# Patient Record
Sex: Male | Born: 1945 | Race: White | Hispanic: No | Marital: Married | Smoking: Current every day smoker
Health system: Southern US, Community
[De-identification: ages and names within clinical notes are randomized; demographics above are authoritative.]

## PROBLEM LIST (undated history)

## (undated) DIAGNOSIS — S42009A Fracture of unspecified part of unspecified clavicle, initial encounter for closed fracture: Secondary | ICD-10-CM

## (undated) DIAGNOSIS — E079 Disorder of thyroid, unspecified: Secondary | ICD-10-CM

## (undated) DIAGNOSIS — I671 Cerebral aneurysm, nonruptured: Secondary | ICD-10-CM

## (undated) DIAGNOSIS — E7800 Pure hypercholesterolemia, unspecified: Secondary | ICD-10-CM

## (undated) DIAGNOSIS — C4490 Unspecified malignant neoplasm of skin, unspecified: Secondary | ICD-10-CM

## (undated) DIAGNOSIS — K219 Gastro-esophageal reflux disease without esophagitis: Secondary | ICD-10-CM

## (undated) HISTORY — PX: CEREBRAL ANEURYSM REPAIR: SHX164

---

## 1999-08-22 ENCOUNTER — Encounter: Admission: RE | Admit: 1999-08-22 | Discharge: 1999-08-22 | Payer: Self-pay | Admitting: Family Medicine

## 1999-11-29 ENCOUNTER — Ambulatory Visit (HOSPITAL_BASED_OUTPATIENT_CLINIC_OR_DEPARTMENT_OTHER): Admission: RE | Admit: 1999-11-29 | Discharge: 1999-11-29 | Payer: Self-pay | Admitting: Plastic Surgery

## 2000-06-18 ENCOUNTER — Encounter: Payer: Self-pay | Admitting: Neurological Surgery

## 2000-06-18 ENCOUNTER — Ambulatory Visit (HOSPITAL_COMMUNITY): Admission: RE | Admit: 2000-06-18 | Discharge: 2000-06-18 | Payer: Self-pay | Admitting: Neurological Surgery

## 2006-01-31 ENCOUNTER — Encounter: Payer: Self-pay | Admitting: Family Medicine

## 2006-06-19 ENCOUNTER — Encounter: Admission: RE | Admit: 2006-06-19 | Discharge: 2006-06-19 | Payer: Self-pay | Admitting: Family Medicine

## 2008-01-28 ENCOUNTER — Ambulatory Visit: Payer: Self-pay | Admitting: Gastroenterology

## 2008-02-10 ENCOUNTER — Ambulatory Visit: Payer: Self-pay | Admitting: Gastroenterology

## 2008-02-10 ENCOUNTER — Encounter: Payer: Self-pay | Admitting: Gastroenterology

## 2008-02-11 ENCOUNTER — Encounter: Payer: Self-pay | Admitting: Gastroenterology

## 2011-12-18 ENCOUNTER — Encounter (HOSPITAL_BASED_OUTPATIENT_CLINIC_OR_DEPARTMENT_OTHER): Payer: Self-pay | Admitting: *Deleted
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
## 2011-12-21 ENCOUNTER — Ambulatory Visit (HOSPITAL_BASED_OUTPATIENT_CLINIC_OR_DEPARTMENT_OTHER): Payer: BC Managed Care – PPO | Admitting: Anesthesiology

## 2011-12-21 ENCOUNTER — Other Ambulatory Visit: Payer: Self-pay

## 2011-12-21 ENCOUNTER — Encounter (HOSPITAL_BASED_OUTPATIENT_CLINIC_OR_DEPARTMENT_OTHER): Payer: Self-pay | Admitting: Anesthesiology

## 2011-12-21 ENCOUNTER — Encounter (HOSPITAL_BASED_OUTPATIENT_CLINIC_OR_DEPARTMENT_OTHER)
Admission: RE | Disposition: A | Discharge status: Home / Self Care | Payer: Self-pay | Source: Ambulatory Visit | Attending: Orthopedic Surgery

## 2011-12-21 ENCOUNTER — Ambulatory Visit (HOSPITAL_BASED_OUTPATIENT_CLINIC_OR_DEPARTMENT_OTHER)
Admission: RE | Admit: 2011-12-21 | Discharge: 2011-12-22 | Disposition: A | Discharge status: Home / Self Care | Payer: BC Managed Care – PPO | Source: Ambulatory Visit | Attending: Orthopedic Surgery | Admitting: Orthopedic Surgery

## 2011-12-21 ENCOUNTER — Encounter (HOSPITAL_BASED_OUTPATIENT_CLINIC_OR_DEPARTMENT_OTHER): Payer: Self-pay | Admitting: *Deleted

## 2011-12-21 DIAGNOSIS — Y998 Other external cause status: Secondary | ICD-10-CM | POA: Insufficient documentation

## 2011-12-21 DIAGNOSIS — F17200 Nicotine dependence, unspecified, uncomplicated: Secondary | ICD-10-CM | POA: Insufficient documentation

## 2011-12-21 DIAGNOSIS — Z4789 Encounter for other orthopedic aftercare: Secondary | ICD-10-CM

## 2011-12-21 DIAGNOSIS — M1710 Unilateral primary osteoarthritis, unspecified knee: Secondary | ICD-10-CM | POA: Insufficient documentation

## 2011-12-21 DIAGNOSIS — S42023A Displaced fracture of shaft of unspecified clavicle, initial encounter for closed fracture: Secondary | ICD-10-CM | POA: Insufficient documentation

## 2011-12-21 HISTORY — DX: Fracture of unspecified part of unspecified clavicle, initial encounter for closed fracture: S42.009A

## 2011-12-21 HISTORY — PX: ORIF CLAVICULAR FRACTURE: SHX5055

## 2011-12-21 SURGERY — OPEN REDUCTION INTERNAL FIXATION (ORIF) CLAVICULAR FRACTURE
Anesthesia: General | Site: Shoulder | Laterality: Left | Wound class: Contaminated

## 2011-12-21 MED ORDER — SODIUM CHLORIDE 0.9 % IV SOLN: INTRAVENOUS | Status: DC

## 2011-12-21 MED ORDER — CEFAZOLIN SODIUM 1-5 GM-% IV SOLN: 1.0000 g | INTRAVENOUS | Status: DC

## 2011-12-21 MED ORDER — ONDANSETRON HCL 4 MG/2ML IJ SOLN: 4.0000 mg | Freq: Once | INTRAMUSCULAR | Status: AC | PRN

## 2011-12-21 MED ORDER — LACTATED RINGERS IV SOLN
INTRAVENOUS | Status: DC
  Administered 2011-12-21 (×2): via INTRAVENOUS

## 2011-12-21 MED ORDER — MIDAZOLAM HCL 5 MG/5ML IJ SOLN
INTRAMUSCULAR | Status: DC | PRN
  Administered 2011-12-21: 2 mg via INTRAVENOUS

## 2011-12-21 MED ORDER — METHOCARBAMOL 100 MG/ML IJ SOLN: 500.0000 mg | Freq: Four times a day (QID) | INTRAVENOUS | Status: DC | PRN

## 2011-12-21 MED ORDER — HYDROMORPHONE HCL PF 1 MG/ML IJ SOLN: 0.5000 mg | INTRAMUSCULAR | Status: DC | PRN

## 2011-12-21 MED ORDER — ONDANSETRON HCL 4 MG/2ML IJ SOLN
INTRAMUSCULAR | Status: DC | PRN
  Administered 2011-12-21: 4 mg via INTRAVENOUS

## 2011-12-21 MED ORDER — SUCCINYLCHOLINE CHLORIDE 20 MG/ML IJ SOLN
INTRAMUSCULAR | Status: DC | PRN
  Administered 2011-12-21: 100 mg via INTRAVENOUS

## 2011-12-21 MED ORDER — METOCLOPRAMIDE HCL 5 MG/ML IJ SOLN: 5.0000 mg | Freq: Three times a day (TID) | INTRAMUSCULAR | Status: DC | PRN

## 2011-12-21 MED ORDER — CEFAZOLIN SODIUM 1-5 GM-% IV SOLN
INTRAVENOUS | Status: DC | PRN
  Administered 2011-12-21: 2 g via INTRAVENOUS

## 2011-12-21 MED ORDER — FENTANYL CITRATE 0.05 MG/ML IJ SOLN
INTRAMUSCULAR | Status: DC | PRN
  Administered 2011-12-21: 100 ug via INTRAVENOUS
  Administered 2011-12-21 (×2): 50 ug via INTRAVENOUS

## 2011-12-21 MED ORDER — HYDROMORPHONE HCL PF 1 MG/ML IJ SOLN
0.2500 mg | INTRAMUSCULAR | Status: DC | PRN
  Administered 2011-12-21 (×4): 0.5 mg via INTRAVENOUS

## 2011-12-21 MED ORDER — LIDOCAINE HCL (CARDIAC) 20 MG/ML IV SOLN
INTRAVENOUS | Status: DC | PRN
  Administered 2011-12-21: 100 mg via INTRAVENOUS

## 2011-12-21 MED ORDER — POTASSIUM CHLORIDE IN NACL 20-0.9 MEQ/L-% IV SOLN: INTRAVENOUS | Status: DC

## 2011-12-21 MED ORDER — METOCLOPRAMIDE HCL 5 MG PO TABS: 5.0000 mg | ORAL_TABLET | Freq: Three times a day (TID) | ORAL | Status: DC | PRN

## 2011-12-21 MED ORDER — BUPIVACAINE HCL (PF) 0.5 % IJ SOLN
INTRAMUSCULAR | Status: DC | PRN
  Administered 2011-12-21: 20 mL

## 2011-12-21 MED ORDER — ONDANSETRON HCL 4 MG/2ML IJ SOLN: 4.0000 mg | Freq: Four times a day (QID) | INTRAMUSCULAR | Status: DC | PRN

## 2011-12-21 MED ORDER — METHOCARBAMOL 500 MG PO TABS
500.0000 mg | ORAL_TABLET | Freq: Four times a day (QID) | ORAL | Status: DC | PRN
  Administered 2011-12-21: 500 mg via ORAL

## 2011-12-21 MED ORDER — DEXAMETHASONE SODIUM PHOSPHATE 4 MG/ML IJ SOLN
INTRAMUSCULAR | Status: DC | PRN
  Administered 2011-12-21: 10 mg via INTRAVENOUS

## 2011-12-21 MED ORDER — CEFAZOLIN SODIUM-DEXTROSE 2-3 GM-% IV SOLR: 2.0000 g | INTRAVENOUS | Status: DC

## 2011-12-21 MED ORDER — OXYCODONE-ACETAMINOPHEN 5-325 MG PO TABS
1.0000 | ORAL_TABLET | ORAL | Status: DC | PRN
  Administered 2011-12-21 – 2011-12-22 (×3): 2 via ORAL

## 2011-12-21 MED ORDER — MORPHINE SULFATE 2 MG/ML IJ SOLN: 0.0500 mg/kg | INTRAMUSCULAR | Status: DC | PRN

## 2011-12-21 MED ORDER — KETOROLAC TROMETHAMINE 30 MG/ML IJ SOLN
INTRAMUSCULAR | Status: DC | PRN
  Administered 2011-12-21: 30 mg via INTRAVENOUS

## 2011-12-21 MED ORDER — ONDANSETRON HCL 4 MG PO TABS: 4.0000 mg | ORAL_TABLET | Freq: Four times a day (QID) | ORAL | Status: DC | PRN

## 2011-12-21 MED ORDER — EPHEDRINE SULFATE 50 MG/ML IJ SOLN
INTRAMUSCULAR | Status: DC | PRN
  Administered 2011-12-21: 5 mg via INTRAVENOUS
  Administered 2011-12-21: 10 mg via INTRAVENOUS

## 2011-12-21 MED ORDER — LORATADINE 10 MG PO TABS: 10.0000 mg | ORAL_TABLET | Freq: Every day | ORAL | Status: DC

## 2011-12-21 MED ORDER — CEFAZOLIN SODIUM 1-5 GM-% IV SOLN
1.0000 g | Freq: Three times a day (TID) | INTRAVENOUS | Status: DC
  Administered 2011-12-21 – 2011-12-22 (×2): 1 g via INTRAVENOUS

## 2011-12-21 MED ORDER — PROPOFOL 10 MG/ML IV EMUL
INTRAVENOUS | Status: DC | PRN
  Administered 2011-12-21 (×2): 50 mg via INTRAVENOUS
  Administered 2011-12-21: 200 mg via INTRAVENOUS

## 2011-12-21 SURGICAL SUPPLY — 57 items
BENZOIN TINCTURE PRP APPL 2/3 (GAUZE/BANDAGES/DRESSINGS) IMPLANT
BIT DRILL 2.8X5 QR DISP (BIT) ×2 IMPLANT
BLADE SURG 15 STRL LF DISP TIS (BLADE) ×1 IMPLANT
BLADE SURG 15 STRL SS (BLADE) ×1
BLADE SURG ROTATE 9660 (MISCELLANEOUS) IMPLANT
CLOTH BEACON ORANGE TIMEOUT ST (SAFETY) ×2 IMPLANT
DECANTER SPIKE VIAL GLASS SM (MISCELLANEOUS) IMPLANT
DRAPE U-SHAPE 47X51 STRL (DRAPES) ×2 IMPLANT
DRAPE U-SHAPE 76X120 STRL (DRAPES) ×4 IMPLANT
DURAPREP 26ML APPLICATOR (WOUND CARE) ×2 IMPLANT
ELECT REM PT RETURN 9FT ADLT (ELECTROSURGICAL) ×2
ELECTRODE REM PT RTRN 9FT ADLT (ELECTROSURGICAL) ×1 IMPLANT
GAUZE XEROFORM 1X8 LF (GAUZE/BANDAGES/DRESSINGS) ×2 IMPLANT
GLOVE BIO SURGEON STRL SZ 6.5 (GLOVE) ×2 IMPLANT
GLOVE BIOGEL PI IND STRL 7.0 (GLOVE) ×1 IMPLANT
GLOVE BIOGEL PI IND STRL 8 (GLOVE) ×1 IMPLANT
GLOVE BIOGEL PI INDICATOR 7.0 (GLOVE) ×1
GLOVE BIOGEL PI INDICATOR 8 (GLOVE) ×1
GLOVE ORTHO TXT STRL SZ7.5 (GLOVE) ×4 IMPLANT
GOWN BRE IMP PREV XXLGXLNG (GOWN DISPOSABLE) ×2 IMPLANT
GOWN PREVENTION PLUS XLARGE (GOWN DISPOSABLE) ×4 IMPLANT
IMMOBILIZER SHOULDER XLGE (ORTHOPEDIC SUPPLIES) ×2 IMPLANT
NS IRRIG 1000ML POUR BTL (IV SOLUTION) ×2 IMPLANT
PACK ARTHROSCOPY DSU (CUSTOM PROCEDURE TRAY) ×2 IMPLANT
PACK BASIN DAY SURGERY FS (CUSTOM PROCEDURE TRAY) ×2 IMPLANT
PENCIL BUTTON HOLSTER BLD 10FT (ELECTRODE) ×2 IMPLANT
PLATE ANT CLAV 6 HOLE (Plate) ×2 IMPLANT
SCREW BN 20X3.5XHEXNS CORT (Screw) ×1 IMPLANT
SCREW CORT 3.5X16 (Screw) ×6 IMPLANT
SCREW CORT 3.5X20 (Screw) ×1 IMPLANT
SCREW CORTICAL 3.5X22 (Screw) ×4 IMPLANT
SLEEVE SCD COMPRESS KNEE MED (MISCELLANEOUS) ×2 IMPLANT
SLING ARM FOAM STRAP LRG (SOFTGOODS) IMPLANT
SLING ARM FOAM STRAP MED (SOFTGOODS) IMPLANT
SLING ARM FOAM STRAP XLG (SOFTGOODS) IMPLANT
SLING ARM IMMOBILIZER LRG (SOFTGOODS) IMPLANT
SLING ARM IMMOBILIZER MED (SOFTGOODS) IMPLANT
SPONGE GAUZE 4X4 12PLY (GAUZE/BANDAGES/DRESSINGS) ×2 IMPLANT
SPONGE LAP 18X18 X RAY DECT (DISPOSABLE) ×4 IMPLANT
STAPLER VISISTAT 35W (STAPLE) ×2 IMPLANT
STRIP CLOSURE SKIN 1/2X4 (GAUZE/BANDAGES/DRESSINGS) IMPLANT
SUCTION FRAZIER TIP 10 FR DISP (SUCTIONS) ×2 IMPLANT
SUT ETHILON 3 0 PS 1 (SUTURE) IMPLANT
SUT FIBERWIRE #2 38 T-5 BLUE (SUTURE)
SUT RETRIEVER MED (INSTRUMENTS) IMPLANT
SUT VIC AB 0 CT1 27 (SUTURE) ×1
SUT VIC AB 0 CT1 27XBRD ANBCTR (SUTURE) ×1 IMPLANT
SUT VIC AB 2-0 SH 27 (SUTURE) ×1
SUT VIC AB 2-0 SH 27XBRD (SUTURE) ×1 IMPLANT
SUT VIC AB 3-0 SH 27 (SUTURE)
SUT VIC AB 3-0 SH 27X BRD (SUTURE) IMPLANT
SUTURE FIBERWR #2 38 T-5 BLUE (SUTURE) IMPLANT
SYR BULB 3OZ (MISCELLANEOUS) IMPLANT
SYR BULB IRRIGATION 50ML (SYRINGE) ×2 IMPLANT
TOWEL OR 17X24 6PK STRL BLUE (TOWEL DISPOSABLE) ×2 IMPLANT
WATER STERILE IRR 1000ML POUR (IV SOLUTION) IMPLANT
YANKAUER SUCT BULB TIP NO VENT (SUCTIONS) ×2 IMPLANT

## 2011-12-21 NOTE — Brief Op Note (Signed)
12/21/2011  2:40 PM  PATIENT:  Alejandro Arnold  66 y.o. male  PRE-OPERATIVE DIAGNOSIS:  left clavicle fracture closed  POST-OPERATIVE DIAGNOSIS:  left clavicle fracture closed  PROCEDURE:  Procedure(s) (LRB): OPEN REDUCTION INTERNAL FIXATION (ORIF) CLAVICULAR FRACTURE (Left)  SURGEON:  Surgeon(s) and Role:    * Loreta Ave, MD - Primary  PHYSICIAN ASSISTANT: Zita Ozimek M  ANESTHESIA:   general  EBL:  Total I/O In: 1500 [I.V.:1500] Out: -    SPECIMEN:  No Specimen  DISPOSITION OF SPECIMEN:  N/A  COUNTS:  YES  TOURNIQUET:  * No tourniquets in log *  PATIENT DISPOSITION:  PACU - hemodynamically stable.

## 2011-12-21 NOTE — Anesthesia Procedure Notes (Signed)
Procedure Name: Intubation Date/Time: 12/21/2011 1:14 PM Performed by: Gar Gibbon Pre-anesthesia Checklist: Patient identified, Emergency Drugs available, Suction available and Patient being monitored Patient Re-evaluated:Patient Re-evaluated prior to inductionOxygen Delivery Method: Circle System Utilized Preoxygenation: Pre-oxygenation with 100% oxygen Intubation Type: IV induction Ventilation: Mask ventilation without difficulty Laryngoscope Size: Mac and 3 Tube type: Oral Number of attempts: 1 Airway Equipment and Method: stylet and oral airway Placement Confirmation: positive ETCO2 and breath sounds checked- equal and bilateral Secured at: 22 cm Tube secured with: Tape Dental Injury: Teeth and Oropharynx as per pre-operative assessment  Comments: Large, floppy epiglottis. Cords not entirely visualized

## 2011-12-21 NOTE — Interval H&P Note (Signed)
History and Physical Interval Note:  12/21/2011 7:38 AM  Alejandro Arnold  has presented today for surgery, with the diagnosis of left clavicle fracture closed  The various methods of treatment have been discussed with the patient and family. After consideration of risks, benefits and other options for treatment, the patient has consented to  Procedure(s) (LRB): OPEN REDUCTION INTERNAL FIXATION (ORIF) CLAVICULAR FRACTURE (Left) as a surgical intervention .  The patients' history has been reviewed, patient examined, no change in status, stable for surgery.  I have reviewed the patients' chart and labs.  Questions were answered to the patient's satisfaction.     Annel Zunker F

## 2011-12-21 NOTE — Transfer of Care (Signed)
Immediate Anesthesia Transfer of Care Note  Patient: Alejandro Arnold  Procedure(s) Performed: Procedure(s) (LRB): OPEN REDUCTION INTERNAL FIXATION (ORIF) CLAVICULAR FRACTURE (Left)  Patient Location: PACU  Anesthesia Type: General  Level of Consciousness: awake  Airway & Oxygen Therapy: Patient Spontanous Breathing and Patient connected to face mask oxygen  Post-op Assessment: Report given to PACU RN and Post -op Vital signs reviewed and stable  Post vital signs: Reviewed and stable  Complications: No apparent anesthesia complications

## 2011-12-21 NOTE — Anesthesia Postprocedure Evaluation (Signed)
  Anesthesia Post-op Note  Patient: Alejandro Arnold  Procedure(s) Performed: Procedure(s) (LRB): OPEN REDUCTION INTERNAL FIXATION (ORIF) CLAVICULAR FRACTURE (Left)  Patient Location: PACU  Anesthesia Type: General  Level of Consciousness: awake and alert   Airway and Oxygen Therapy: Patient Spontanous Breathing and Patient connected to face mask oxygen  Post-op Pain: mild  Post-op Assessment: Post-op Vital signs reviewed, Patient's Cardiovascular Status Stable, Respiratory Function Stable, Patent Airway, No signs of Nausea or vomiting and Pain level controlled  Post-op Vital Signs: Reviewed and stable  Complications: No apparent anesthesia complications

## 2011-12-21 NOTE — Anesthesia Preprocedure Evaluation (Signed)
Anesthesia Evaluation  Patient identified by MRN, date of birth, ID band Patient awake    Reviewed: Allergy & Precautions, H&P , NPO status , Patient's Chart, lab work & pertinent test results  Airway Mallampati: I TM Distance: >3 FB Neck ROM: Full    Dental  (+) Teeth Intact, Dental Advisory Given and Poor Dentition   Pulmonary  breath sounds clear to auscultation        Cardiovascular Rhythm:Regular Rate:Normal     Neuro/Psych    GI/Hepatic   Endo/Other    Renal/GU      Musculoskeletal   Abdominal   Peds  Hematology   Anesthesia Other Findings   Reproductive/Obstetrics                           Anesthesia Physical Anesthesia Plan  ASA: II  Anesthesia Plan: General   Post-op Pain Management:    Induction: Intravenous  Airway Management Planned: Oral ETT  Additional Equipment:   Intra-op Plan:   Post-operative Plan: Extubation in OR  Informed Consent: I have reviewed the patients History and Physical, chart, labs and discussed the procedure including the risks, benefits and alternatives for the proposed anesthesia with the patient or authorized representative who has indicated his/her understanding and acceptance.     Plan Discussed with: CRNA, Anesthesiologist and Surgeon  Anesthesia Plan Comments:         Anesthesia Quick Evaluation

## 2011-12-22 NOTE — Op Note (Signed)
NAME:  Alejandro Arnold, Alejandro Arnold NO.:  MEDICAL RECORD NO.:  [id number]  LOCATION:                                 FACILITY:  PHYSICIAN:  Loreta Ave, M.D.      DATE OF BIRTH:  DATE OF PROCEDURE:  12/21/2011 DATE OF DISCHARGE:                              OPERATIVE REPORT   PREOPERATIVE DIAGNOSIS:  Markedly displaced overriding clavicle fracture, left.  Junction middle and lateral third.  POSTOPERATIVE DIAGNOSIS:  Markedly displaced overriding clavicle fracture, left.  Junction middle and lateral third.  PROCEDURE:  Open reduction and internal fixation of left clavicle fracture with a 6-hole anterior AcuMed plate and screws.  SURGEON:  Loreta Ave, MD  ASSISTANT:  Genene Churn. Barry Dienes, [place], present throughout the entire case, necessary for timely completion of procedure.  ANESTHESIA:  General.  BLOOD LOSS:  Minimal.  SPECIMENS:  None.  CULTURES:  None.  COMPLICATIONS:  None.  DRESSINGS:  Soft compressive with sling.  PROCEDURE:  The patient was brought to the operating room, placed on the operating room table in supine position.  After adequate anesthesia had been obtained, placed in a beach-chair position on the shoulder positioner, prepped and draped in usual sterile fashion.  Fluoroscopic guidance was used throughout.  Incision along the front of the clavicle. Skin and subcutaneous tissues were divided.  Fracture medially evident with the medial side spike button hole through the fascia. Subperiosteal exposure of the fracture itself.  Utilizing clamps and retractors, this was able to be reduced anatomically.  Then, fascia and anterior 6-hole plate to contour along the front of the clavicle. Holding had reduced, this was then firmly fixed with screws throughout. At completion, nice solid stable fixation.  Anatomic alignment confirmed visually as well as fluoroscopically.  Wound was irrigated. Deltopectoral fascia was closed over top of the  clavicle.  Skin and subcutaneous tissues were closed with Vicryl.  Sterile compressive dressing applied. Sling applied.  Anesthesia was reversed.  Brought to the recovery room. Tolerated the surgery well.  No complications.     Loreta Ave, M.D.     DFM/MEDQ  D:  12/21/2011  T:  12/22/2011  Job:  [id number]

## 2011-12-28 ENCOUNTER — Encounter (HOSPITAL_BASED_OUTPATIENT_CLINIC_OR_DEPARTMENT_OTHER): Payer: Self-pay | Admitting: Orthopedic Surgery

## 2015-08-27 ENCOUNTER — Encounter: Payer: Self-pay | Admitting: Gastroenterology

## 2015-10-03 HISTORY — PX: BICEPS TENDON REPAIR: SHX566

## 2016-02-25 ENCOUNTER — Other Ambulatory Visit: Payer: Self-pay | Admitting: Radiation Oncology

## 2016-02-25 ENCOUNTER — Inpatient Hospital Stay
Admission: RE | Admit: 2016-02-25 | Discharge: 2016-02-25 | Disposition: A | Discharge status: Home / Self Care | Payer: Self-pay | Source: Ambulatory Visit | Attending: Radiation Oncology | Admitting: Radiation Oncology

## 2016-02-25 DIAGNOSIS — C801 Malignant (primary) neoplasm, unspecified: Secondary | ICD-10-CM

## 2016-02-29 ENCOUNTER — Encounter: Payer: Self-pay | Admitting: Radiation Oncology

## 2016-02-29 NOTE — Progress Notes (Addendum)
Thoracic Location of Tumor / Histology:  Enlarging left lower lung nodule hypermetabolic on pet-Ct imaging 01/21/16  Patient presented months ago with symptoms of: none, [facility],  Biopsies of  (if applicable) revealed: none done, sent CD   Tobacco/Marijuana/Snuff/ETOH use: 1ppd x 55 years, decreased now to 4-5 cigarettes daily  Past/Anticipated interventions by cardiothoracic surgery, if any: NO, Seen in the [place]  Dr. Mickel Crow   Past/Anticipated interventions by medical oncology, if any: NO  Signs/Symptoms  Weight changes, if any:Yes loss 16 lbs in past year   Respiratory complaints, if any: NO  Hemoptysis, if any: NO  Pain issues, if any:  Knee pain,  SAFETY ISSUES: NO  Prior radiation? NO  Pacemaker/ICD? NO  Is the patient on methotrexate? NO  Current Complaints / other details:  Married, Nurse, children's; Step children 3 sons, Seen in [place] by Dr.  Juan Quam,  Referral for possible SRS  ;   Father bladder cancer, brother throat cancer, maternal grandfather throat cancer   Allergies:NKA BP 125/70 mmHg  Pulse 62  Temp(Src) 98.3 F (36.8 C) (Oral)  Resp 20  Ht '6\' 2"'$  (1.88 m)  Wt 186 lb 11.2 oz (84.687 kg)  BMI 23.96 kg/m2  SpO2 96%  Wt Readings from Last 3 Encounters:  03/01/16 186 lb 11.2 oz (84.687 kg)  12/18/11 188 lb (85.276 kg)

## 2016-03-01 ENCOUNTER — Ambulatory Visit
Admission: RE | Admit: 2016-03-01 | Discharge: 2016-03-01 | Disposition: A | Discharge status: Home / Self Care | Payer: No Typology Code available for payment source | Source: Ambulatory Visit | Attending: Radiation Oncology | Admitting: Radiation Oncology

## 2016-03-01 ENCOUNTER — Encounter: Payer: Self-pay | Admitting: Radiation Oncology

## 2016-03-01 VITALS — BP 125/70 | HR 62 | Temp 98.3°F | Resp 20 | Ht 74.0 in | Wt 186.7 lb

## 2016-03-01 DIAGNOSIS — E7800 Pure hypercholesterolemia, unspecified: Secondary | ICD-10-CM | POA: Insufficient documentation

## 2016-03-01 DIAGNOSIS — F17200 Nicotine dependence, unspecified, uncomplicated: Secondary | ICD-10-CM | POA: Diagnosis not present

## 2016-03-01 DIAGNOSIS — Z85828 Personal history of other malignant neoplasm of skin: Secondary | ICD-10-CM | POA: Diagnosis not present

## 2016-03-01 DIAGNOSIS — C3432 Malignant neoplasm of lower lobe, left bronchus or lung: Secondary | ICD-10-CM | POA: Insufficient documentation

## 2016-03-01 DIAGNOSIS — Z510 Encounter for antineoplastic radiation therapy: Secondary | ICD-10-CM | POA: Diagnosis present

## 2016-03-01 DIAGNOSIS — K219 Gastro-esophageal reflux disease without esophagitis: Secondary | ICD-10-CM | POA: Insufficient documentation

## 2016-03-01 DIAGNOSIS — Z8052 Family history of malignant neoplasm of bladder: Secondary | ICD-10-CM | POA: Diagnosis not present

## 2016-03-01 DIAGNOSIS — I671 Cerebral aneurysm, nonruptured: Secondary | ICD-10-CM | POA: Diagnosis not present

## 2016-03-01 HISTORY — DX: Gastro-esophageal reflux disease without esophagitis: K21.9

## 2016-03-01 HISTORY — DX: Cerebral aneurysm, nonruptured: I67.1

## 2016-03-01 HISTORY — DX: Unspecified malignant neoplasm of skin, unspecified: C44.90

## 2016-03-01 HISTORY — DX: Pure hypercholesterolemia, unspecified: E78.00

## 2016-03-01 NOTE — Progress Notes (Signed)
Please see the Nurse Progress Note in the MD Initial Consult Encounter for this patient. 
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
## 2016-03-10 ENCOUNTER — Ambulatory Visit
Admission: RE | Admit: 2016-03-10 | Discharge: 2016-03-10 | Disposition: A | Discharge status: Home / Self Care | Payer: No Typology Code available for payment source | Source: Ambulatory Visit | Attending: Radiation Oncology | Admitting: Radiation Oncology

## 2016-03-10 DIAGNOSIS — C3432 Malignant neoplasm of lower lobe, left bronchus or lung: Secondary | ICD-10-CM

## 2016-03-10 DIAGNOSIS — Z510 Encounter for antineoplastic radiation therapy: Secondary | ICD-10-CM | POA: Diagnosis not present

## 2016-03-17 DIAGNOSIS — Z510 Encounter for antineoplastic radiation therapy: Secondary | ICD-10-CM | POA: Diagnosis not present

## 2016-03-29 ENCOUNTER — Ambulatory Visit
Admission: RE | Admit: 2016-03-29 | Discharge: 2016-03-29 | Disposition: A | Discharge status: Home / Self Care | Payer: No Typology Code available for payment source | Source: Ambulatory Visit | Attending: Radiation Oncology | Admitting: Radiation Oncology

## 2016-03-29 DIAGNOSIS — Z510 Encounter for antineoplastic radiation therapy: Secondary | ICD-10-CM | POA: Diagnosis not present

## 2016-04-03 ENCOUNTER — Ambulatory Visit
Admission: RE | Admit: 2016-04-03 | Discharge: 2016-04-03 | Disposition: A | Discharge status: Home / Self Care | Payer: No Typology Code available for payment source | Source: Ambulatory Visit | Attending: Radiation Oncology | Admitting: Radiation Oncology

## 2016-04-03 DIAGNOSIS — Z510 Encounter for antineoplastic radiation therapy: Secondary | ICD-10-CM | POA: Diagnosis not present

## 2016-04-05 ENCOUNTER — Ambulatory Visit: Payer: No Typology Code available for payment source | Admitting: Radiation Oncology

## 2016-04-06 ENCOUNTER — Ambulatory Visit
Admission: RE | Admit: 2016-04-06 | Discharge: 2016-04-06 | Disposition: A | Discharge status: Home / Self Care | Payer: No Typology Code available for payment source | Source: Ambulatory Visit | Attending: Radiation Oncology | Admitting: Radiation Oncology

## 2016-04-06 ENCOUNTER — Encounter: Payer: Self-pay | Admitting: Radiation Oncology

## 2016-04-06 VITALS — BP 164/79 | HR 56 | Temp 97.6°F | Ht 74.0 in | Wt 191.1 lb

## 2016-04-06 DIAGNOSIS — Z510 Encounter for antineoplastic radiation therapy: Secondary | ICD-10-CM | POA: Diagnosis not present

## 2016-04-06 DIAGNOSIS — C3432 Malignant neoplasm of lower lobe, left bronchus or lung: Secondary | ICD-10-CM | POA: Insufficient documentation

## 2016-04-06 NOTE — Progress Notes (Signed)
   Department of Radiation Oncology  Phone:  [phone] Fax:        [phone]  Weekly Treatment Note    Name: ANIAS BARTOL Date: 04/06/2016 MRN: [id number] DOB: 1945/11/22   Diagnosis:     ICD-9-CM ICD-10-CM   1. 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 cetirizine (ZYRTEC) 10 MG tablet Take 10 mg by mouth daily.    . diclofenac (VOLTAREN) 75 MG EC tablet Take 75 mg by mouth daily.    . diclofenac sodium (VOLTAREN) 1 % GEL Apply topically as needed.    Marland Kitchen omeprazole (PRILOSEC) 20 MG capsule Take 20 mg by mouth daily.    Marland Kitchen tiotropium (SPIRIVA) 18 MCG inhalation capsule Place 18 mcg into inhaler and inhale daily. Reported on 04/06/2016     No current facility-administered medications for this encounter.     ALLERGIES: Review of patient's allergies indicates no known allergies.   LABORATORY DATA:  Lab Results  Component Value Date   HGB 16.5 12/21/2011   No results found for: NA, K, CL, CO2 No results found for: ALT, AST, GGT, ALKPHOS, BILITOT   NARRATIVE: Blain R Bonsall was seen today for weekly treatment management. The chart was checked and the patient's films were reviewed.  Siddarth Hsiung has completed treatment with 3 fractions to his left lung.  He denies having pain.  He reports having an occasional cough with white sputum.  He denies having hemoptysis.  He denies having an increase in shortness of breath.  He reports his energy level is "all right."  He has been given a one month follow up appointment.  BP 164/79 mmHg  Pulse 56  Temp(Src) 97.6 F (36.4 C) (Oral)  Ht '6\' 2"'$  (1.88 m)  Wt 191 lb 1.6 oz (86.682 kg)  BMI 24.53 kg/m2  SpO2 99%   Wt Readings from Last 3 Encounters:  04/06/16 191 lb 1.6 oz (86.682 kg)  03/01/16 186 lb 11.2 oz (84.687 kg)  12/18/11 188 lb (85.276 kg)    PHYSICAL EXAMINATION:  height is '6\' 2"'$  (1.88 m) and weight is 191 lb 1.6 oz (86.682 kg). His oral temperature is 97.6 F (36.4 C). His blood pressure is 164/79 and his pulse is 56. His oxygen saturation is 99%.        ASSESSMENT: The patient is doing satisfactorily with treatment.  PLAN: Follow-up in one month.

## 2016-04-06 NOTE — Progress Notes (Signed)
Aizik Reh has completed treatment with 3 fractions to his left lung.  He denies having pain.  He reports having an occasional cough with white sputum.  He denies having hemoptysis.  He denies having an increase in shortness of breath.  He reports his energy level is "all right."  He has been given a one month follow up appointment.  BP 164/79 mmHg  Pulse 56  Temp(Src) 97.6 F (36.4 C) (Oral)  Ht '6\' 2"'$  (1.88 m)  Wt 191 lb 1.6 oz (86.682 kg)  BMI 24.53 kg/m2  SpO2 99%   Wt Readings from Last 3 Encounters:  04/06/16 191 lb 1.6 oz (86.682 kg)  03/01/16 186 lb 11.2 oz (84.687 kg)  12/18/11 188 lb (85.276 kg)

## 2016-04-13 NOTE — Progress Notes (Signed)
°  Radiation Oncology         (336) [phone] ________________________________  Name: WINDEL KEZIAH MRN: [id number]  Date: 04/06/2016  DOB: 04-28-1946  End of Treatment Note  Diagnosis:   The encounter diagnosis was Primary malignant neoplasm of bronchus of left lower lobe ([place]).     Indication for treatment:  Curative       Radiation treatment dates:   03/29/2016 to 04/06/2016  Site/dose:   The Left lung was treated to 54 Gy in 3 fractions at 18 Gy per fraction.   Beams/energy:   SBRT/SRT-3D // 6FFF  Narrative: The patient tolerated radiation treatment relatively well.   He did not experience pain, hemoptysis, fatigue, or an increase in shortness of breath. He did report having an occasional cough with white sputum.   Plan: The patient has completed radiation treatment. The patient will return to radiation oncology clinic for routine followup in one month. I advised them to call or return sooner if they have any questions or concerns related to their recovery or treatment.  ------------------------------------------------  Jodelle Gross, MD, PhD  This document serves as a record of services personally performed by Kyung Rudd, MD. It was created on his behalf by Arlyce Harman, a trained medical scribe. The creation of this record is based on the scribe's personal observations and the provider's statements to them. This document has been checked and approved by the attending provider.

## 2016-04-22 NOTE — Progress Notes (Signed)
  Radiation Oncology         [phone]) [phone] ________________________________  Name: DAILAN PFALZGRAF MRN: [id number]  Date: 03/10/2016  DOB: 1945-11-02  RESPIRATORY MOTION MANAGEMENT SIMULATION  NARRATIVE:  In order to account for effect of respiratory motion on target structures and other organs in the planning and delivery of radiotherapy, this patient underwent respiratory motion management simulation.  To accomplish this, when the patient was brought to the CT simulation planning suite, 4D respiratoy motion management CT images were obtained.  The CT images were loaded into the planning software.  Then, using a variety of tools including Cine, MIP, and standard views, the target volume and planning target volumes (PTV) were delineated.  Avoidance structures were contoured.  Treatment planning then occurred.  Dose volume histograms were generated and reviewed for each of the requested structure.  The resulting plan was carefully reviewed and approved today.  ------------------------------------------------  Jodelle Gross, MD, PhD

## 2016-04-22 NOTE — Addendum Note (Signed)
Encounter addended by: Kyung Rudd, MD on: 04/22/2016 11:44 PM<BR>     Documentation filed: Notes Section

## 2016-04-22 NOTE — Progress Notes (Signed)
[facility] Radiation Oncology Simulation and Treatment Planning Note   Name:  '@PATNAME'$ @ MRN: [id number]   Date: 04/22/2016  DOB: [birth year]  Status:outpatient    DIAGNOSIS: '@CURRDX'$ @   CONSENT VERIFIED:yes   SET UP: Patient is setup supine   IMMOBILIZATION: The patient was immobilized using a Vac Loc bag and Abdominal Compression.   NARRATIVE:The patient was brought to the [facility].  Identity was confirmed.  All relevant records and images related to the planned course of therapy were reviewed.  Then, the patient was positioned in a stable reproducible clinical set-up for radiation therapy. Abdominal compression was applied by me.  4D CT images were obtained and reproducible breathing pattern was confirmed. Free breathing CT images were obtained.  Skin markings were placed.  The CT images were loaded into the planning software where the target and avoidance structures were contoured.  The radiation prescription was entered and confirmed.    TREATMENT PLANNING NOTE:  Treatment planning then occurred. I have requested : MLC's, isodose plan, basic dose calculation.  3 dimensional simulation is performed and dose volume histogram of the gross tumor volume, planning tumor volume and criticial normal structures including the spinal cord and lungs were analyzed and requested.  Special treatment procedure was performed due to high dose per fraction.  The patient will be monitored for increased risk of toxicity.  Daily imaging using cone beam CT will be used for target localization.  The left lung tumor will receive 54 gray in 3 fractions.   ------------------------------------------------  Jodelle Gross, MD, PhD

## 2016-04-22 NOTE — Addendum Note (Signed)
Encounter addended by: Kyung Rudd, MD on: 04/22/2016 11:02 PM<BR>     Documentation filed: Notes Section, Visit Diagnoses

## 2016-05-09 ENCOUNTER — Ambulatory Visit: Payer: Self-pay | Admitting: Radiation Oncology

## 2017-10-18 NOTE — Progress Notes (Signed)
Please place orders in Epic as patient is being scheduled for a pre-op appointment! Thank you! 

## 2017-10-19 ENCOUNTER — Ambulatory Visit: Payer: Self-pay | Admitting: Orthopedic Surgery

## 2017-10-23 NOTE — Progress Notes (Addendum)
10-12-17 Surgery clearance on chart from Dr. Alferd Patee  09-12-17 Pulmonary clearance on chart from Dr. Gwenette Greet  08-03-17 CT Thorax w/o Contrast on chart

## 2017-10-23 NOTE — Patient Instructions (Signed)
Alejandro Arnold Arnold  10/23/2017   Your procedure is scheduled on: 10-25-17   Report to [facility] Main  Entrance Report to Admitting at 1:30 PM   Call this number if you have problems the morning of surgery [phone]     Remember: Do not eat food or drink liquids :After Midnight. You may have a Clear Liquid Diet from Midnight until 10:00 AM. After 10:00 AM, nothing until after surgery.     CLEAR LIQUID DIET   Foods Allowed                                                                     Foods Excluded  Coffee and tea, regular and decaf                             liquids that you cannot  Plain Jell-O in any flavor                                             see through such as: Fruit ices (not with fruit pulp)                                     milk, soups, orange juice  Iced Popsicles                                    All solid food Carbonated beverages, regular and diet                                    Cranberry, grape and apple juices Sports drinks like Gatorade Lightly seasoned clear broth or consume(fat free) Sugar, honey syrup  Sample Menu Breakfast                                Lunch                                     Supper Cranberry juice                    Beef broth                            Chicken broth Jell-O                                     Grape juice  Apple juice Coffee or tea                        Jell-O                                      Popsicle                                                Coffee or tea                        Coffee or tea  _____________________________________________________________________     Take these medicines the morning of surgery with A SIP OF WATER: Cetirizine (Zyrtec), and Omeprazole (Prilosec). You may also bring and use your inhaler as needed.                                You may not have any metal on your body including hair pins and              piercings   Do not wear jewelry, make-up, lotions, powders or perfumes, deodorant             Do not wear nail polish.  Do not shave  48 hours prior to surgery.              Men may shave face and neck.   Do not bring valuables to the hospital. [place].  Contacts, dentures or bridgework may not be worn into surgery.  Leave suitcase in the car. After surgery it may be brought to your room.                 Please read over the following fact sheets you were given: _____________________________________________________________________          [facility] - Preparing for Surgery Before surgery, you Alejandro Arnold play an important role.  Because skin is not sterile, your skin needs to be as free of germs as possible.  You Alejandro Arnold reduce the number of germs on your skin by washing with CHG (chlorahexidine gluconate) soap before surgery.  CHG is an antiseptic cleaner which kills germs and bonds with the skin to continue killing germs even after washing. Please DO NOT use if you have an allergy to CHG or antibacterial soaps.  If your skin becomes reddened/irritated stop using the CHG and inform your nurse when you arrive at Short Stay. Do not shave (including legs and underarms) for at least 48 hours prior to the first CHG shower.  You may shave your face/neck. Please follow these instructions carefully:  1.  Shower with CHG Soap the night before surgery and the  morning of Surgery.  2.  If you choose to wash your hair, wash your hair first as usual with your  normal  shampoo.  3.  After you shampoo, rinse your hair and body thoroughly to remove the  shampoo.  4.  Use CHG as you would any other liquid soap.  You Alejandro Arnold apply chg directly  to the skin and wash                       Gently with a scrungie or clean washcloth.  5.  Apply the CHG Soap to your body ONLY FROM THE NECK DOWN.   Do not use on face/ open                           Wound or open  sores. Avoid contact with eyes, ears mouth and genitals (private parts).                       Wash face,  Genitals (private parts) with your normal soap.             6.  Wash thoroughly, paying special attention to the area where your surgery  will be performed.  7.  Thoroughly rinse your body with warm water from the neck down.  8.  DO NOT shower/wash with your normal soap after using and rinsing off  the CHG Soap.                9.  Pat yourself dry with a clean towel.            10.  Wear clean pajamas.            11.  Place clean sheets on your bed the night of your first shower and do not  sleep with pets. Day of Surgery : Do not apply any lotions/deodorants the morning of surgery.  Please wear clean clothes to the hospital/surgery center.  FAILURE TO FOLLOW THESE INSTRUCTIONS MAY RESULT IN THE CANCELLATION OF YOUR SURGERY PATIENT SIGNATURE_________________________________  NURSE SIGNATURE__________________________________  ________________________________________________________________________   Alejandro Arnold Arnold  An incentive spirometer is a tool that Alejandro Arnold help keep your lungs clear and active. This tool measures how well you are filling your lungs with each breath. Taking long deep breaths may help reverse or decrease the chance of developing breathing (pulmonary) problems (especially infection) following:  A long period of time when you are unable to move or be active. BEFORE THE PROCEDURE   If the spirometer includes an indicator to show your best effort, your nurse or respiratory therapist will set it to a desired goal.  If possible, sit up straight or lean slightly forward. Try not to slouch.  Hold the incentive spirometer in an upright position. INSTRUCTIONS FOR USE  1. Sit on the edge of your bed if possible, or sit up as far as you Alejandro Arnold in bed or on a chair. 2. Hold the incentive spirometer in an upright position. 3. Breathe out normally. 4. Place the mouthpiece  in your mouth and seal your lips tightly around it. 5. Breathe in slowly and as deeply as possible, raising the piston or the ball toward the top of the column. 6. Hold your breath for 3-5 seconds or for as long as possible. Allow the piston or ball to fall to the bottom of the column. 7. Remove the mouthpiece from your mouth and breathe out normally. 8. Rest for a few seconds and repeat Steps 1 through 7 at least 10 times every 1-2 hours when you are awake. Take your time and take a few normal breaths between deep breaths. 9. The spirometer may include an indicator to  show your best effort. Use the indicator as a goal to work toward during each repetition. 10. After each set of 10 deep breaths, practice coughing to be sure your lungs are clear. If you have an incision (the cut made at the time of surgery), support your incision when coughing by placing a pillow or rolled up towels firmly against it. Once you are able to get out of bed, walk around indoors and cough well. You may stop using the incentive spirometer when instructed by your caregiver.  RISKS AND COMPLICATIONS  Take your time so you do not get dizzy or light-headed.  If you are in pain, you may need to take or ask for pain medication before doing incentive spirometry. It is harder to take a deep breath if you are having pain. AFTER USE  Rest and breathe slowly and easily.  It Alejandro Arnold be helpful to keep track of a log of your progress. Your caregiver Alejandro Arnold provide you with a simple table to help with this. If you are using the spirometer at home, follow these instructions: [place] IF:   You are having difficultly using the spirometer.  You have trouble using the spirometer as often as instructed.  Your pain medication is not giving enough relief while using the spirometer.  You develop fever of 100.5 F (38.1 C) or higher. SEEK IMMEDIATE MEDICAL CARE IF:   You cough up bloody sputum that had not been present  before.  You develop fever of 102 F (38.9 C) or greater.  You develop worsening pain at or near the incision site. MAKE SURE YOU:   Understand these instructions.  Will watch your condition.  Will get help right away if you are not doing well or get worse. Document Released: 01/29/2007 Document Revised: 12/11/2011 Document Reviewed: 04/01/2007 Exit[employer] Patient Information 2014 [employer], [place].   ________________________________________________________________________  WHAT IS A BLOOD TRANSFUSION? Blood Transfusion Information  A transfusion is the replacement of blood or some of its parts. Blood is made up of multiple cells which provide different functions.  Red blood cells carry oxygen and are used for blood loss replacement.  White blood cells fight against infection.  Platelets control bleeding.  Plasma helps clot blood.  Other blood products are available for specialized needs, such as hemophilia or other clotting disorders. BEFORE THE TRANSFUSION  Who gives blood for transfusions?   Healthy volunteers who are fully evaluated to make sure their blood is safe. This is blood bank blood. Transfusion therapy is the safest it has ever been in the practice of medicine. Before blood is taken from a donor, a complete history is taken to make sure that person has no history of diseases nor engages in risky social behavior (examples are intravenous drug use or sexual activity with multiple partners). The donor's travel history is screened to minimize risk of transmitting infections, such as malaria. The donated blood is tested for signs of infectious diseases, such as HIV and hepatitis. The blood is then tested to be sure it is compatible with you in order to minimize the chance of a transfusion reaction. If you or a relative donates blood, this is often done in anticipation of surgery and is not appropriate for emergency situations. It takes many days to process the donated  blood. RISKS AND COMPLICATIONS Although transfusion therapy is very safe and saves many lives, the main dangers of transfusion include:   Getting an infectious disease.  Developing a transfusion reaction. This is an allergic reaction  to something in the blood you were given. Every precaution is taken to prevent this. The decision to have a blood transfusion has been considered carefully by your caregiver before blood is given. Blood is not given unless the benefits outweigh the risks. AFTER THE TRANSFUSION  Right after receiving a blood transfusion, you will usually feel much better and more energetic. This is especially true if your red blood cells have gotten low (anemic). The transfusion raises the level of the red blood cells which carry oxygen, and this usually causes an energy increase.  The nurse administering the transfusion will monitor you carefully for complications. HOME CARE INSTRUCTIONS  No special instructions are needed after a transfusion. You may find your energy is better. Speak with your caregiver about any limitations on activity for underlying diseases you may have. SEEK MEDICAL CARE IF:   Your condition is not improving after your transfusion.  You develop redness or irritation at the intravenous (IV) site. SEEK IMMEDIATE MEDICAL CARE IF:  Any of the following symptoms occur over the next 12 hours:  Shaking chills.  You have a temperature by mouth above 102 F (38.9 C), not controlled by medicine.  Chest, back, or muscle pain.  People around you feel you are not acting correctly or are confused.  Shortness of breath or difficulty breathing.  Dizziness and fainting.  You get a rash or develop hives.  You have a decrease in urine output.  Your urine turns a dark color or changes to pink, red, or brown. Any of the following symptoms occur over the next 10 days:  You have a temperature by mouth above 102 F (38.9 C), not controlled by  medicine.  Shortness of breath.  Weakness after normal activity.  The white part of the eye turns yellow (jaundice).  You have a decrease in the amount of urine or are urinating less often.  Your urine turns a dark color or changes to pink, red, or brown. Document Released: 09/15/2000 Document Revised: 12/11/2011 Document Reviewed: 05/04/2008 [facility] Patient Information 2014 [place], [place].  _______________________________________________________________________

## 2017-10-24 ENCOUNTER — Encounter (HOSPITAL_COMMUNITY)
Admission: RE | Admit: 2017-10-24 | Discharge: 2017-10-24 | Disposition: A | Discharge status: Home / Self Care | Payer: Non-veteran care | Source: Ambulatory Visit | Attending: Orthopedic Surgery | Admitting: Orthopedic Surgery

## 2017-10-24 ENCOUNTER — Other Ambulatory Visit: Payer: Self-pay

## 2017-10-24 ENCOUNTER — Ambulatory Visit: Payer: Self-pay | Admitting: Orthopedic Surgery

## 2017-10-24 ENCOUNTER — Encounter (HOSPITAL_COMMUNITY): Payer: Self-pay

## 2017-10-24 LAB — BASIC METABOLIC PANEL
Anion gap: 7 (ref 5–15)
BUN: 15 mg/dL (ref 6–20)
CALCIUM: 9.1 mg/dL (ref 8.9–10.3)
CHLORIDE: 103 mmol/L (ref 101–111)
CO2: 26 mmol/L (ref 22–32)
CREATININE: 0.93 mg/dL (ref 0.61–1.24)
GFR calc Af Amer: >60 mL/min (ref >60)
GFR calc non Af Amer: >60 mL/min (ref >60)
Glucose, Bld: 102 mg/dL — ABNORMAL HIGH (ref 65–99)
Potassium: 4.7 mmol/L (ref 3.5–5.1)
Sodium: 136 mmol/L (ref 135–145)

## 2017-10-24 LAB — SURGICAL PCR SCREEN
MRSA, PCR: NEGATIVE
Staphylococcus aureus: NEGATIVE

## 2017-10-24 LAB — CBC
HCT: 39.6 % (ref 39.0–52.0)
Hemoglobin: 13.3 g/dL (ref 13.0–17.0)
MCH: 31.4 pg (ref 26.0–34.0)
MCHC: 33.6 g/dL (ref 30.0–36.0)
MCV: 93.6 fL (ref 78.0–100.0)
PLATELETS: 254 10*3/uL (ref 150–400)
RBC: 4.23 MIL/uL (ref 4.22–5.81)
RDW: 13.8 % (ref 11.5–15.5)
WBC: 12 10*3/uL — ABNORMAL HIGH (ref 4.0–10.5)

## 2017-10-24 LAB — ABO/RH: ABO/RH(D): A POS

## 2017-10-24 MED ORDER — TRANEXAMIC ACID 1000 MG/10ML IV SOLN
1000.0000 mg | INTRAVENOUS | Status: AC
  Administered 2017-10-25: 1000 mg via INTRAVENOUS
  Filled 2017-10-24: qty 1100
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
## 2017-10-25 ENCOUNTER — Other Ambulatory Visit: Payer: Self-pay

## 2017-10-25 ENCOUNTER — Inpatient Hospital Stay (HOSPITAL_COMMUNITY)
Admission: RE | Admit: 2017-10-25 | Discharge: 2017-10-27 | DRG: 470 | Disposition: A | Discharge status: Home Health | Payer: Non-veteran care | Source: Ambulatory Visit | Attending: Orthopedic Surgery | Admitting: Orthopedic Surgery

## 2017-10-25 ENCOUNTER — Encounter (HOSPITAL_COMMUNITY)
Admission: RE | Disposition: A | Discharge status: Home Health | Payer: Self-pay | Source: Ambulatory Visit | Attending: Orthopedic Surgery

## 2017-10-25 ENCOUNTER — Inpatient Hospital Stay (HOSPITAL_COMMUNITY): Payer: Non-veteran care

## 2017-10-25 ENCOUNTER — Inpatient Hospital Stay (HOSPITAL_COMMUNITY): Payer: Non-veteran care | Admitting: Certified Registered Nurse Anesthetist

## 2017-10-25 ENCOUNTER — Encounter (HOSPITAL_COMMUNITY): Payer: Self-pay | Admitting: *Deleted

## 2017-10-25 ENCOUNTER — Other Ambulatory Visit (HOSPITAL_COMMUNITY): Payer: No Typology Code available for payment source

## 2017-10-25 DIAGNOSIS — Z96651 Presence of right artificial knee joint: Secondary | ICD-10-CM

## 2017-10-25 DIAGNOSIS — K219 Gastro-esophageal reflux disease without esophagitis: Secondary | ICD-10-CM | POA: Diagnosis present

## 2017-10-25 DIAGNOSIS — E7800 Pure hypercholesterolemia, unspecified: Secondary | ICD-10-CM | POA: Diagnosis present

## 2017-10-25 DIAGNOSIS — F17210 Nicotine dependence, cigarettes, uncomplicated: Secondary | ICD-10-CM | POA: Diagnosis present

## 2017-10-25 DIAGNOSIS — Z79899 Other long term (current) drug therapy: Secondary | ICD-10-CM | POA: Diagnosis not present

## 2017-10-25 DIAGNOSIS — Z85828 Personal history of other malignant neoplasm of skin: Secondary | ICD-10-CM

## 2017-10-25 DIAGNOSIS — M1711 Unilateral primary osteoarthritis, right knee: Principal | ICD-10-CM | POA: Diagnosis present

## 2017-10-25 HISTORY — PX: KNEE ARTHROPLASTY: SHX992

## 2017-10-25 LAB — TYPE AND SCREEN
ABO/RH(D): A POS
ANTIBODY SCREEN: NEGATIVE

## 2017-10-25 SURGERY — ARTHROPLASTY, KNEE, TOTAL, USING IMAGELESS COMPUTER-ASSISTED NAVIGATION
Anesthesia: Spinal | Site: Knee | Laterality: Right

## 2017-10-25 MED ORDER — MIDAZOLAM HCL 2 MG/2ML IJ SOLN
INTRAMUSCULAR | Status: AC
  Administered 2017-10-25: 2 mg via INTRAVENOUS
  Filled 2017-10-25: qty 2

## 2017-10-25 MED ORDER — HYDROCODONE-ACETAMINOPHEN 5-325 MG PO TABS
2.0000 | ORAL_TABLET | ORAL | Status: DC | PRN
  Administered 2017-10-25 – 2017-10-27 (×9): 2 via ORAL
  Filled 2017-10-25 (×8): qty 2

## 2017-10-25 MED ORDER — KETOROLAC TROMETHAMINE 30 MG/ML IJ SOLN
INTRAMUSCULAR | Status: DC | PRN
  Administered 2017-10-25: 30 mg

## 2017-10-25 MED ORDER — BUPIVACAINE-EPINEPHRINE 0.25% -1:200000 IJ SOLN
INTRAMUSCULAR | Status: DC | PRN
  Administered 2017-10-25: 30 mL

## 2017-10-25 MED ORDER — CEFAZOLIN SODIUM-DEXTROSE 2-4 GM/100ML-% IV SOLN
2.0000 g | Freq: Four times a day (QID) | INTRAVENOUS | Status: AC
  Administered 2017-10-25 – 2017-10-26 (×2): 2 g via INTRAVENOUS
  Filled 2017-10-25 (×2): qty 100

## 2017-10-25 MED ORDER — SODIUM CHLORIDE 0.9 % IR SOLN
Status: DC | PRN
  Administered 2017-10-25: 4000 mL

## 2017-10-25 MED ORDER — ROPIVACAINE HCL 7.5 MG/ML IJ SOLN
INTRAMUSCULAR | Status: DC | PRN
  Administered 2017-10-25: 20 mL via PERINEURAL

## 2017-10-25 MED ORDER — SODIUM CHLORIDE 0.9 % IV SOLN: INTRAVENOUS | Status: DC

## 2017-10-25 MED ORDER — LACTATED RINGERS IV SOLN: INTRAVENOUS | Status: DC | PRN

## 2017-10-25 MED ORDER — PROPOFOL 10 MG/ML IV BOLUS
INTRAVENOUS | Status: AC
  Filled 2017-10-25: qty 40

## 2017-10-25 MED ORDER — CEFAZOLIN SODIUM-DEXTROSE 2-4 GM/100ML-% IV SOLN
2.0000 g | INTRAVENOUS | Status: AC
  Administered 2017-10-25: 2 g via INTRAVENOUS
  Filled 2017-10-25: qty 100

## 2017-10-25 MED ORDER — HYDROMORPHONE HCL 1 MG/ML IJ SOLN: 0.2500 mg | INTRAMUSCULAR | Status: DC | PRN

## 2017-10-25 MED ORDER — DIPHENHYDRAMINE HCL 12.5 MG/5ML PO ELIX: 12.5000 mg | ORAL_SOLUTION | ORAL | Status: DC | PRN

## 2017-10-25 MED ORDER — PHENYLEPHRINE 40 MCG/ML (10ML) SYRINGE FOR IV PUSH (FOR BLOOD PRESSURE SUPPORT)
PREFILLED_SYRINGE | INTRAVENOUS | Status: AC
  Filled 2017-10-25: qty 10

## 2017-10-25 MED ORDER — ACETAMINOPHEN 10 MG/ML IV SOLN
1000.0000 mg | INTRAVENOUS | Status: AC
  Administered 2017-10-25: 1000 mg via INTRAVENOUS
  Filled 2017-10-25: qty 100

## 2017-10-25 MED ORDER — MEPERIDINE HCL 25 MG/ML IJ SOLN: 6.2500 mg | INTRAMUSCULAR | Status: DC | PRN

## 2017-10-25 MED ORDER — METHOCARBAMOL 1000 MG/10ML IJ SOLN
500.0000 mg | Freq: Four times a day (QID) | INTRAVENOUS | Status: DC | PRN
  Administered 2017-10-25: 500 mg via INTRAVENOUS
  Filled 2017-10-25: qty 550

## 2017-10-25 MED ORDER — ACETAMINOPHEN 325 MG PO TABS: 650.0000 mg | ORAL_TABLET | ORAL | Status: DC | PRN

## 2017-10-25 MED ORDER — METOCLOPRAMIDE HCL 5 MG/ML IJ SOLN: 5.0000 mg | Freq: Three times a day (TID) | INTRAMUSCULAR | Status: DC | PRN

## 2017-10-25 MED ORDER — TIOTROPIUM BROMIDE MONOHYDRATE 18 MCG IN CAPS
18.0000 ug | ORAL_CAPSULE | Freq: Every day | RESPIRATORY_TRACT | Status: DC
  Administered 2017-10-26 – 2017-10-27 (×2): 18 ug via RESPIRATORY_TRACT
  Filled 2017-10-25: qty 5

## 2017-10-25 MED ORDER — ONDANSETRON HCL 4 MG/2ML IJ SOLN: 4.0000 mg | Freq: Once | INTRAMUSCULAR | Status: DC | PRN

## 2017-10-25 MED ORDER — ISOPROPYL ALCOHOL 70 % SOLN
Status: DC | PRN
  Administered 2017-10-25: 1 via TOPICAL

## 2017-10-25 MED ORDER — KETOROLAC TROMETHAMINE 15 MG/ML IJ SOLN
7.5000 mg | Freq: Four times a day (QID) | INTRAMUSCULAR | Status: AC
  Administered 2017-10-26 (×3): 7.5 mg via INTRAVENOUS
  Filled 2017-10-25 (×3): qty 1

## 2017-10-25 MED ORDER — POVIDONE-IODINE 10 % EX SWAB: 2.0000 "application " | Freq: Once | CUTANEOUS | Status: DC

## 2017-10-25 MED ORDER — POLYETHYLENE GLYCOL 3350 17 G PO PACK: 17.0000 g | PACK | Freq: Every day | ORAL | Status: DC | PRN

## 2017-10-25 MED ORDER — PROPOFOL 500 MG/50ML IV EMUL
INTRAVENOUS | Status: DC | PRN
  Administered 2017-10-25: 75 ug/kg/min via INTRAVENOUS

## 2017-10-25 MED ORDER — DEXAMETHASONE SODIUM PHOSPHATE 10 MG/ML IJ SOLN
10.0000 mg | Freq: Once | INTRAMUSCULAR | Status: AC
  Administered 2017-10-26: 10 mg via INTRAVENOUS
  Filled 2017-10-25: qty 1

## 2017-10-25 MED ORDER — METOCLOPRAMIDE HCL 5 MG PO TABS: 5.0000 mg | ORAL_TABLET | Freq: Three times a day (TID) | ORAL | Status: DC | PRN

## 2017-10-25 MED ORDER — METHOCARBAMOL 500 MG PO TABS
500.0000 mg | ORAL_TABLET | Freq: Four times a day (QID) | ORAL | Status: DC | PRN
  Administered 2017-10-26 – 2017-10-27 (×5): 500 mg via ORAL
  Filled 2017-10-25 (×5): qty 1

## 2017-10-25 MED ORDER — BUPIVACAINE HCL (PF) 0.25 % IJ SOLN
INTRAMUSCULAR | Status: AC
  Filled 2017-10-25: qty 30

## 2017-10-25 MED ORDER — STERILE WATER FOR IRRIGATION IR SOLN
Status: DC | PRN
Start: 2017-10-25 — End: 2017-10-25
  Administered 2017-10-25: 2000 mL

## 2017-10-25 MED ORDER — KETOROLAC TROMETHAMINE 30 MG/ML IJ SOLN
INTRAMUSCULAR | Status: AC
  Filled 2017-10-25: qty 1

## 2017-10-25 MED ORDER — ASPIRIN 81 MG PO CHEW: 81.0000 mg | CHEWABLE_TABLET | Freq: Two times a day (BID) | ORAL | Status: DC

## 2017-10-25 MED ORDER — MIDAZOLAM HCL 2 MG/2ML IJ SOLN
1.0000 mg | INTRAMUSCULAR | Status: DC
  Administered 2017-10-25: 2 mg via INTRAVENOUS

## 2017-10-25 MED ORDER — LORATADINE 10 MG PO TABS
10.0000 mg | ORAL_TABLET | Freq: Every day | ORAL | Status: DC
  Administered 2017-10-26 – 2017-10-27 (×2): 10 mg via ORAL
  Filled 2017-10-25 (×2): qty 1

## 2017-10-25 MED ORDER — FENTANYL CITRATE (PF) 100 MCG/2ML IJ SOLN
50.0000 ug | INTRAMUSCULAR | Status: DC
  Administered 2017-10-25: 100 ug via INTRAVENOUS

## 2017-10-25 MED ORDER — HYDROCODONE-ACETAMINOPHEN 5-325 MG PO TABS
1.0000 | ORAL_TABLET | ORAL | Status: DC | PRN
  Filled 2017-10-25 (×2): qty 1

## 2017-10-25 MED ORDER — DOCUSATE SODIUM 100 MG PO CAPS
100.0000 mg | ORAL_CAPSULE | Freq: Two times a day (BID) | ORAL | Status: DC
  Administered 2017-10-25 – 2017-10-27 (×4): 100 mg via ORAL
  Filled 2017-10-25 (×4): qty 1

## 2017-10-25 MED ORDER — LIDOCAINE 2% (20 MG/ML) 5 ML SYRINGE
INTRAMUSCULAR | Status: AC
  Filled 2017-10-25: qty 5

## 2017-10-25 MED ORDER — PANTOPRAZOLE SODIUM 40 MG PO TBEC
40.0000 mg | DELAYED_RELEASE_TABLET | Freq: Every day | ORAL | Status: DC
  Administered 2017-10-26 – 2017-10-27 (×2): 40 mg via ORAL
  Filled 2017-10-25 (×2): qty 1

## 2017-10-25 MED ORDER — ONDANSETRON HCL 4 MG/2ML IJ SOLN
INTRAMUSCULAR | Status: DC | PRN
  Administered 2017-10-25: 4 mg via INTRAVENOUS

## 2017-10-25 MED ORDER — EPHEDRINE 5 MG/ML INJ
INTRAVENOUS | Status: AC
  Filled 2017-10-25: qty 10

## 2017-10-25 MED ORDER — CHLORHEXIDINE GLUCONATE 4 % EX LIQD: 60.0000 mL | Freq: Once | CUTANEOUS | Status: DC

## 2017-10-25 MED ORDER — SENNA 8.6 MG PO TABS
2.0000 | ORAL_TABLET | Freq: Every day | ORAL | Status: DC
  Administered 2017-10-25 – 2017-10-26 (×2): 17.2 mg via ORAL
  Filled 2017-10-25 (×2): qty 2

## 2017-10-25 MED ORDER — ALUM & MAG HYDROXIDE-SIMETH 200-200-20 MG/5ML PO SUSP: 30.0000 mL | ORAL | Status: DC | PRN

## 2017-10-25 MED ORDER — MENTHOL 3 MG MT LOZG: 1.0000 | LOZENGE | OROMUCOSAL | Status: DC | PRN

## 2017-10-25 MED ORDER — EPHEDRINE SULFATE-NACL 50-0.9 MG/10ML-% IV SOSY
PREFILLED_SYRINGE | INTRAVENOUS | Status: DC | PRN
  Administered 2017-10-25 (×3): 5 mg via INTRAVENOUS

## 2017-10-25 MED ORDER — ACETAMINOPHEN 650 MG RE SUPP: 650.0000 mg | RECTAL | Status: DC | PRN

## 2017-10-25 MED ORDER — ONDANSETRON HCL 4 MG/2ML IJ SOLN: 4.0000 mg | Freq: Four times a day (QID) | INTRAMUSCULAR | Status: DC | PRN

## 2017-10-25 MED ORDER — HYDROMORPHONE HCL 1 MG/ML IJ SOLN
0.5000 mg | INTRAMUSCULAR | Status: DC | PRN
  Administered 2017-10-26 (×2): 1 mg via INTRAVENOUS
  Filled 2017-10-25 (×2): qty 1

## 2017-10-25 MED ORDER — PHENOL 1.4 % MT LIQD: 1.0000 | OROMUCOSAL | Status: DC | PRN

## 2017-10-25 MED ORDER — SODIUM CHLORIDE 0.9 % IJ SOLN
INTRAMUSCULAR | Status: DC | PRN
  Administered 2017-10-25: 30 mL

## 2017-10-25 MED ORDER — ONDANSETRON HCL 4 MG PO TABS: 4.0000 mg | ORAL_TABLET | Freq: Four times a day (QID) | ORAL | Status: DC | PRN

## 2017-10-25 MED ORDER — LACTATED RINGERS IV SOLN
INTRAVENOUS | Status: DC
  Administered 2017-10-25: 14:00:00 via INTRAVENOUS

## 2017-10-25 MED ORDER — SODIUM CHLORIDE 0.9 % IJ SOLN
INTRAMUSCULAR | Status: AC
  Filled 2017-10-25: qty 50

## 2017-10-25 MED ORDER — PROPOFOL 10 MG/ML IV BOLUS
INTRAVENOUS | Status: DC | PRN
  Administered 2017-10-25: 20 mg via INTRAVENOUS

## 2017-10-25 MED ORDER — FENTANYL CITRATE (PF) 100 MCG/2ML IJ SOLN
INTRAMUSCULAR | Status: AC
  Administered 2017-10-25: 100 ug via INTRAVENOUS
  Filled 2017-10-25: qty 2

## 2017-10-25 MED ORDER — SODIUM CHLORIDE 0.9 % IV SOLN
INTRAVENOUS | Status: DC
  Administered 2017-10-25: 22:00:00 via INTRAVENOUS

## 2017-10-25 MED ORDER — ONDANSETRON HCL 4 MG/2ML IJ SOLN
INTRAMUSCULAR | Status: AC
  Filled 2017-10-25: qty 2

## 2017-10-25 MED ORDER — LACTATED RINGERS IV SOLN
INTRAVENOUS | Status: DC | PRN
  Administered 2017-10-25 (×2): via INTRAVENOUS

## 2017-10-25 SURGICAL SUPPLY — 57 items
BAG ZIPLOCK 12X15 (MISCELLANEOUS) IMPLANT
BANDAGE ACE 4X5 VEL STRL LF (GAUZE/BANDAGES/DRESSINGS) ×3 IMPLANT
BANDAGE ACE 6X5 VEL STRL LF (GAUZE/BANDAGES/DRESSINGS) ×3 IMPLANT
BATTERY INSTRU NAVIGATION (MISCELLANEOUS) ×9 IMPLANT
BLADE SAW RECIPROCATING 77.5 (BLADE) ×3 IMPLANT
CAPT KNEE TRIATH TK-4 ×3 IMPLANT
CHLORAPREP W/TINT 26ML (MISCELLANEOUS) ×6 IMPLANT
COVER SURGICAL LIGHT HANDLE (MISCELLANEOUS) ×3 IMPLANT
CUFF TOURN SGL QUICK 34 (TOURNIQUET CUFF) ×2
CUFF TRNQT CYL 34X4X40X1 (TOURNIQUET CUFF) ×1 IMPLANT
DECANTER SPIKE VIAL GLASS SM (MISCELLANEOUS) ×6 IMPLANT
DERMABOND ADVANCED (GAUZE/BANDAGES/DRESSINGS) ×2
DERMABOND ADVANCED .7 DNX12 (GAUZE/BANDAGES/DRESSINGS) ×1 IMPLANT
DRAPE SHEET LG 3/4 BI-LAMINATE (DRAPES) ×6 IMPLANT
DRAPE U-SHAPE 47X51 STRL (DRAPES) ×3 IMPLANT
DRSG AQUACEL AG ADV 3.5X10 (GAUZE/BANDAGES/DRESSINGS) ×3 IMPLANT
DRSG TEGADERM 4X4.75 (GAUZE/BANDAGES/DRESSINGS) IMPLANT
ELECT BLADE TIP CTD 4 INCH (ELECTRODE) ×3 IMPLANT
ELECT REM PT RETURN 15FT ADLT (MISCELLANEOUS) ×3 IMPLANT
EVACUATOR 1/8 PVC DRAIN (DRAIN) IMPLANT
GAUZE SPONGE 4X4 12PLY STRL (GAUZE/BANDAGES/DRESSINGS) ×3 IMPLANT
GLOVE BIO SURGEON STRL SZ8.5 (GLOVE) ×6 IMPLANT
GLOVE BIOGEL PI IND STRL 8.5 (GLOVE) ×1 IMPLANT
GLOVE BIOGEL PI INDICATOR 8.5 (GLOVE) ×2
GOWN SPEC L3 XXLG W/TWL (GOWN DISPOSABLE) ×3 IMPLANT
HANDPIECE INTERPULSE COAX TIP (DISPOSABLE) ×2
HOOD PEEL AWAY FLYTE STAYCOOL (MISCELLANEOUS) ×6 IMPLANT
MARKER SKIN DUAL TIP RULER LAB (MISCELLANEOUS) ×3 IMPLANT
NEEDLE SPNL 18GX3.5 QUINCKE PK (NEEDLE) ×3 IMPLANT
NS IRRIG 1000ML POUR BTL (IV SOLUTION) ×3 IMPLANT
PACK TOTAL KNEE CUSTOM (KITS) ×3 IMPLANT
PADDING CAST ABS 6INX4YD NS (CAST SUPPLIES) ×2
PADDING CAST ABS COTTON 6X4 NS (CAST SUPPLIES) ×1 IMPLANT
PADDING CAST COTTON 6X4 STRL (CAST SUPPLIES) ×3 IMPLANT
POSITIONER SURGICAL ARM (MISCELLANEOUS) ×3 IMPLANT
SAW OSC TIP CART 19.5X105X1.3 (SAW) ×3 IMPLANT
SEALER BIPOLAR AQUA 6.0 (INSTRUMENTS) ×3 IMPLANT
SET HNDPC FAN SPRY TIP SCT (DISPOSABLE) ×1 IMPLANT
SET PAD KNEE POSITIONER (MISCELLANEOUS) ×3 IMPLANT
SPONGE DRAIN TRACH 4X4 STRL 2S (GAUZE/BANDAGES/DRESSINGS) IMPLANT
SPONGE LAP 18X18 X RAY DECT (DISPOSABLE) IMPLANT
SUCTION FRAZIER HANDLE 12FR (TUBING) ×2
SUCTION TUBE FRAZIER 12FR DISP (TUBING) ×1 IMPLANT
SUT MNCRL AB 3-0 PS2 18 (SUTURE) ×3 IMPLANT
SUT MON AB 2-0 CT1 36 (SUTURE) ×6 IMPLANT
SUT STRATAFIX PDO 1 14 VIOLET (SUTURE) ×2
SUT STRATFX PDO 1 14 VIOLET (SUTURE) ×1
SUT VIC AB 1 CT1 36 (SUTURE) ×9 IMPLANT
SUT VIC AB 2-0 CT1 27 (SUTURE) ×2
SUT VIC AB 2-0 CT1 TAPERPNT 27 (SUTURE) ×1 IMPLANT
SUTURE STRATFX PDO 1 14 VIOLET (SUTURE) ×1 IMPLANT
SYR 50ML LL SCALE MARK (SYRINGE) ×3 IMPLANT
TOWER CARTRIDGE SMART MIX (DISPOSABLE) IMPLANT
TRAY FOLEY W/METER SILVER 16FR (SET/KITS/TRAYS/PACK) IMPLANT
WATER STERILE IRR 1000ML POUR (IV SOLUTION) ×6 IMPLANT
WRAP KNEE MAXI GEL POST OP (GAUZE/BANDAGES/DRESSINGS) ×3 IMPLANT
YANKAUER SUCT BULB TIP 10FT TU (MISCELLANEOUS) ×3 IMPLANT

## 2017-10-25 NOTE — Transfer of Care (Signed)
Immediate Anesthesia Transfer of Care Note  Patient: Alejandro Arnold  Procedure(s) Performed: RIGHT TOTAL KNEE ARTHROPLASTY WITH COMPUTER NAVIGATION (Right Knee)  Patient Location: PACU  Anesthesia Type:Spinal  Level of Consciousness: drowsy and patient cooperative  Airway & Oxygen Therapy: Patient Spontanous Breathing and Patient connected to face mask  Post-op Assessment: Report given to RN and Post -op Vital signs reviewed and stable  Post vital signs: Reviewed and stable  Last Vitals:  Vitals:   10/25/17 1514 10/25/17 1515  BP: (!) 141/78   Pulse: (!) 52 (!) 52  Resp: 19 18  Temp:    SpO2: 100% 100%    Last Pain:  Vitals:   10/25/17 1406  TempSrc:   PainSc: 8       Patients Stated Pain Goal: 4 (07/68/08 8110)  Complications: No apparent anesthesia complications

## 2017-10-25 NOTE — Progress Notes (Signed)
AssistedDr. Ossey with right, ultrasound guided, adductor canal block. Side rails up, monitors on throughout procedure. See vital signs in flow sheet. Tolerated Procedure well.  

## 2017-10-25 NOTE — Anesthesia Preprocedure Evaluation (Signed)
Anesthesia Evaluation  Patient identified by MRN, date of birth, ID band Patient awake    Reviewed: Allergy & Precautions, Patient's Chart, lab work & pertinent test results  Airway Mallampati: I  TM Distance: >3 FB Neck ROM: Full    Dental   Pulmonary Current Smoker,    Pulmonary exam normal        Cardiovascular Normal cardiovascular exam     Neuro/Psych    GI/Hepatic GERD  Medicated and Controlled,  Endo/Other    Renal/GU      Musculoskeletal   Abdominal   Peds  Hematology   Anesthesia Other Findings   Reproductive/Obstetrics                             Anesthesia Physical Anesthesia Plan  ASA: II  Anesthesia Plan: Spinal   Post-op Pain Management:  Regional for Post-op pain   Induction: Intravenous  PONV Risk Score and Plan: 0  Airway Management Planned: Simple Face Mask  Additional Equipment:   Intra-op Plan:   Post-operative Plan:   Informed Consent: I have reviewed the patients History and Physical, chart, labs and discussed the procedure including the risks, benefits and alternatives for the proposed anesthesia with the patient or authorized representative who has indicated his/her understanding and acceptance.     Plan Discussed with: CRNA and Surgeon  Anesthesia Plan Comments:         Anesthesia Quick Evaluation

## 2017-10-25 NOTE — Op Note (Signed)
OPERATIVE REPORT  SURGEON: Rod Can, MD   ASSISTANT: Staff.  PREOPERATIVE DIAGNOSIS: Right knee arthritis.   POSTOPERATIVE DIAGNOSIS: Right knee arthritis.   PROCEDURE: Right total knee arthroplasty.   IMPLANTS: Stryker Triathlon CR femur, size 6. Stryker Tritanium tibia, size 5. X3 polyethelyene insert, size 9 mm, CR. 3 button asymmetric patella, size 35 mm.  ANESTHESIA:  Regional and Spinal  TOURNIQUET TIME: Not utilized.   ESTIMATED BLOOD LOSS: 200 mL.    ANTIBIOTICS: 2 g Ancef.  DRAINS: None.  COMPLICATIONS: None   CONDITION: PACU - hemodynamically stable.   BRIEF CLINICAL NOTE: Alejandro Arnold is a 72 y.o. male with a long-standing history of Right knee arthritis. After failing conservative management, the patient was indicated for total knee arthroplasty. The risks, benefits, and alternatives to the procedure were explained, and the patient elected to proceed.  PROCEDURE IN DETAIL: Adductor canal block was obtained in the pre-op holding area. Once inside the operative room, spinal anesthesia was obtained, and a foley catheter was inserted. The patient was then positioned, a nonsterile tourniquet was placed, and the lower extremity was prepped and draped in the normal sterile surgical fashion. A time-out was called verifying side and site of surgery. The patient received IV antibiotics within 60 minutes of beginning the procedure. The tourniquet was not utilized.  An anterior approach to the knee was performed utilizing a midvastus arthrotomy. A medial release was performed and the patellar fat pad was excised. Stryker navigation was used to cut the distal femur perpendicular to the mechanical axis. A freehand patellar resection was performed, and the patella was sized an prepared with 3 lug holes.  Nagivation was used to make a neutral proximal tibia resection, taking  9 mm of bone from the less affected lateral side with 3 degrees of slope. The menisci were excised. A spacer block was placed, and the alignment and balance in extension were confirmed.   The distal femur was sized using the 3-degree external rotation guide referencing the posterior femoral cortex. The appropriate 4-in-1 cutting block was pinned into place. Rotation was checked using Whiteside's line, the epicondylar axis, and then confirmed with a spacer block in flexion. The remaining femoral cuts were performed, taking care to protect the MCL.  The tibia was sized and the trial tray was pinned into place. The remaining trail components were inserted. The knee was stable to varus and valgus stress through a full range of motion. The patella tracked centrally, and the PCL was well balanced. The trial components were removed, and the proximal tibial surface was prepared. Final components were impacted into place. The knee was tested for a final time and found to be well balanced.  The wound was copiously irrigated with normal saline with pulse lavage. Marcaine solution was injected into the periarticular soft tissue. The wound was closed in layers using #1 Vicryl and Stratafix for the fascia, 2-0 Vicryl for the subcutaneous fat, 2-0 Monocryl for the deep dermal layer, 3-0 running Monocryl subcuticular Stitch, and Dermabond for the skin. Once the glue was fully dried, an Aquacell Ag and compressive dressing were applied. Tthe patient was transported to the recovery room in stable condition. Sponge, needle, and instrument counts were correct at the end of the case x2. The patient tolerated the procedure well and there were no known complications.

## 2017-10-25 NOTE — Anesthesia Procedure Notes (Signed)
Anesthesia Regional Block: Adductor canal block   Pre-Anesthetic Checklist: ,, timeout performed, Correct Patient, Correct Site, Correct Laterality, Correct Procedure, Correct Position, site marked, Risks and benefits discussed,  Surgical consent,  Pre-op evaluation,  At surgeon's request and post-op pain management  Laterality: Left  Prep: chloraprep       Needles:  Injection technique: Single-shot  Needle Type: Echogenic Stimulator Needle     Needle Length: 9cm  Needle Gauge: 21     Additional Needles:   Narrative:  Start time: 10/25/2017 2:35 PM End time: 10/25/2017 2:45 PM Injection made incrementally with aspirations every 5 mL.  Performed by: Personally  Anesthesiologist: Lillia Abed, MD  Additional Notes: Monitors applied. Patient sedated. Sterile prep and drape,hand hygiene and sterile gloves were used. Relevant anatomy identified.Needle position confirmed.Local anesthetic injected incrementally after negative aspiration. Local anesthetic spread visualized around nerve(s). Vascular puncture avoided. No complications. Image printed for medical record.The patient tolerated the procedure well.    Lillia Abed MD

## 2017-10-25 NOTE — Plan of Care (Signed)
initiated

## 2017-10-25 NOTE — Anesthesia Postprocedure Evaluation (Signed)
Anesthesia Post Note  Patient: Alejandro Arnold  Procedure(s) Performed: RIGHT TOTAL KNEE ARTHROPLASTY WITH COMPUTER NAVIGATION (Right Knee)     Patient location during evaluation: PACU Anesthesia Type: Spinal Level of consciousness: oriented and awake and alert Pain management: pain level controlled Vital Signs Assessment: post-procedure vital signs reviewed and stable Respiratory status: spontaneous breathing, respiratory function stable and patient connected to nasal cannula oxygen Cardiovascular status: blood pressure returned to baseline and stable Postop Assessment: no headache, no backache and no apparent nausea or vomiting Anesthetic complications: no    Last Vitals:  Vitals:   10/25/17 1857 10/25/17 2003  BP: (!) 161/71 (!) 160/67  Pulse: (!) 56 (!) 48  Resp: 18 17  Temp: 36.6 C (!) 36.4 C  SpO2: 97% 100%    Last Pain:  Vitals:   10/25/17 2003  TempSrc: Oral  PainSc:                  [name] DAVID

## 2017-10-25 NOTE — Interval H&P Note (Signed)
History and Physical Interval Note:  10/25/2017 3:26 PM  Alejandro Arnold  has presented today for surgery, with the diagnosis of Degenerative joint disease right knee  The various methods of treatment have been discussed with the patient and family. After consideration of risks, benefits and other options for treatment, the patient has consented to  Procedure(s) with comments: RIGHT TOTAL KNEE ARTHROPLASTY WITH COMPUTER NAVIGATION (Right) - NEEDS RNFA as a surgical intervention .  The patient's history has been reviewed, patient examined, no change in status, stable for surgery.  I have reviewed the patient's chart and labs.  Questions were answered to the patient's satisfaction.     Hilton Cork Kyrus Hyde

## 2017-10-26 ENCOUNTER — Encounter (HOSPITAL_COMMUNITY): Payer: Self-pay | Admitting: Orthopedic Surgery

## 2017-10-26 LAB — BASIC METABOLIC PANEL
Anion gap: 6 (ref 5–15)
BUN: 13 mg/dL (ref 6–20)
CALCIUM: 8.6 mg/dL — AB (ref 8.9–10.3)
CHLORIDE: 105 mmol/L (ref 101–111)
CO2: 28 mmol/L (ref 22–32)
CREATININE: 0.89 mg/dL (ref 0.61–1.24)
GFR calc Af Amer: >60 mL/min (ref >60)
GFR calc non Af Amer: >60 mL/min (ref >60)
GLUCOSE: 99 mg/dL (ref 65–99)
Potassium: 4.3 mmol/L (ref 3.5–5.1)
Sodium: 139 mmol/L (ref 135–145)

## 2017-10-26 LAB — CBC
HEMATOCRIT: 36.8 % — AB (ref 39.0–52.0)
HEMOGLOBIN: 12.2 g/dL — AB (ref 13.0–17.0)
MCH: 31 pg (ref 26.0–34.0)
MCHC: 33.2 g/dL (ref 30.0–36.0)
MCV: 93.6 fL (ref 78.0–100.0)
Platelets: 221 10*3/uL (ref 150–400)
RBC: 3.93 MIL/uL — ABNORMAL LOW (ref 4.22–5.81)
RDW: 13.5 % (ref 11.5–15.5)
WBC: 9.6 10*3/uL (ref 4.0–10.5)

## 2017-10-26 MED ORDER — APIXABAN 2.5 MG PO TABS: 2.5000 mg | ORAL_TABLET | Freq: Two times a day (BID) | ORAL | 0 refills | Status: AC

## 2017-10-26 MED ORDER — SENNA 8.6 MG PO TABS: 2.0000 | ORAL_TABLET | Freq: Every day | ORAL | 0 refills | Status: AC

## 2017-10-26 MED ORDER — ONDANSETRON HCL 4 MG PO TABS: 4.0000 mg | ORAL_TABLET | Freq: Four times a day (QID) | ORAL | 0 refills | Status: AC | PRN

## 2017-10-26 MED ORDER — ASPIRIN 81 MG PO CHEW: 81.0000 mg | CHEWABLE_TABLET | Freq: Two times a day (BID) | ORAL | 1 refills | Status: AC

## 2017-10-26 MED ORDER — APIXABAN 2.5 MG PO TABS
2.5000 mg | ORAL_TABLET | Freq: Two times a day (BID) | ORAL | Status: DC
  Administered 2017-10-26 – 2017-10-27 (×3): 2.5 mg via ORAL
  Filled 2017-10-26 (×3): qty 1

## 2017-10-26 MED ORDER — DOCUSATE SODIUM 100 MG PO CAPS: 100.0000 mg | ORAL_CAPSULE | Freq: Two times a day (BID) | ORAL | 1 refills | Status: AC

## 2017-10-26 MED ORDER — DICLOFENAC SODIUM 75 MG PO TBEC: 75.0000 mg | DELAYED_RELEASE_TABLET | Freq: Every day | ORAL | 3 refills | Status: AC

## 2017-10-26 MED ORDER — HYDROCODONE-ACETAMINOPHEN 5-325 MG PO TABS: 1.0000 | ORAL_TABLET | ORAL | 0 refills | Status: AC | PRN
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
## 2017-10-26 NOTE — Progress Notes (Signed)
Discharge planning, spoke with patient at bedside. [facility] chosen [facility] PT, evaluate and treat. Contacted [facility] for referral. [address]. [phone]

## 2017-10-26 NOTE — Progress Notes (Signed)
    Subjective:  Patient reports pain as mild to moderate.  Denies N/V/CP/SOB.  Objective:   VITALS:   Vitals:   10/25/17 2202 10/25/17 2322 10/26/17 0031 10/26/17 0506  BP: (!) 147/72  134/74 (!) 142/71  Pulse: (!) 54  60 (!) 56  Resp: 16  15 17   Temp: (!) 97.4 F (36.3 C)  97.7 F (36.5 C) 97.8 F (36.6 C)  TempSrc: Oral  Oral Oral  SpO2: 100%  99% 98%  Weight:  82.1 kg (181 lb)    Height:  6\' 2"  (1.88 m)      NAD ABD soft Sensation intact distally Intact pulses distally Dorsiflexion/Plantar flexion intact Incision: dressing C/D/I Compartment soft   Lab Results  Component Value Date   WBC 9.6 10/26/2017   HGB 12.2 (L) 10/26/2017   HCT 36.8 (L) 10/26/2017   MCV 93.6 10/26/2017   PLT 221 10/26/2017   BMET    Component Value Date/Time   NA 139 10/26/2017 0527   K 4.3 10/26/2017 0527   CL 105 10/26/2017 0527   CO2 28 10/26/2017 0527   GLUCOSE 99 10/26/2017 0527   BUN 13 10/26/2017 0527   CREATININE 0.89 10/26/2017 0527   CALCIUM 8.6 (L) 10/26/2017 0527   GFRNONAA >60 10/26/2017 0527   GFRAA >60 10/26/2017 0527     Assessment/Plan: 1 Day Post-Op   Principal Problem:   Osteoarthritis of right knee   WBAT with walker DVT ppx: apixaban, SCDs, TEDS PO pain control PT/OT Dispo: d/c home tomorrow with HHPT   Hilton Cork Chavy [facility] 10/26/2017, 8:21 AM   Rod Can, MD Cell [phone]
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
## 2017-10-26 NOTE — Discharge Instructions (Signed)
° °Dr. Brian Swinteck °Total Joint Specialist °[facility] °[address] °[place], [place] [address] °[phone] ° °TOTAL KNEE REPLACEMENT POSTOPERATIVE DIRECTIONS ° ° ° °Knee Rehabilitation, Guidelines Following Surgery  °Results after knee surgery are often greatly improved when you follow the exercise, range of motion and muscle strengthening exercises prescribed by your doctor. Safety measures are also important to protect the knee from further injury. Any time any of these exercises cause you to have increased pain or swelling in your knee joint, decrease the amount until you are comfortable again and slowly increase them. If you have problems or questions, call your caregiver or physical therapist for advice.  ° °WEIGHT BEARING °Weight bearing as tolerated with assist device (walker, cane, etc) as directed, use it as long as suggested by your surgeon or therapist, typically at least 4-6 weeks. ° °HOME CARE INSTRUCTIONS  °Remove items at home which could result in a fall. This includes throw rugs or furniture in walking pathways.  °Continue medications as instructed at time of discharge. °You may have some home medications which will be placed on hold until you complete the course of blood thinner medication.  °You may start showering once you are discharged home but do not submerge the incision under water. Just pat the incision dry and apply a dry gauze dressing on daily. °Walk with walker as instructed.  °You may resume a sexual relationship in one month or when given the OK by your doctor.  °· Use walker as long as suggested by your caregivers. °· Avoid periods of inactivity such as sitting longer than an hour when not asleep. This helps prevent blood clots.  °You may put full weight on your legs and walk as much as is comfortable.  °You may return to work once you are cleared by your doctor.  °Do not drive a car for 6 weeks or until released by you surgeon.  °· Do not drive  while taking narcotics.  °Wear the elastic stockings for three weeks following surgery during the day but you may remove then at night. °Make sure you keep all of your appointments after your operation with all of your doctors and caregivers. You should call the office at the above phone number and make an appointment for approximately two weeks after the date of your surgery. °Do not remove your surgical dressing. The dressing is waterproof; you may take showers in 3 days, but do not take tub baths or submerge the dressing. °Please pick up a stool softener and laxative for home use as long as you are requiring pain medications. °· ICE to the affected knee every three hours for 30 minutes at a time and then as needed for pain and swelling.  Continue to use ice on the knee for pain and swelling from surgery. You may notice swelling that will progress down to the foot and ankle.  This is normal after surgery.  Elevate the leg when you are not up walking on it.   °It is important for you to complete the blood thinner medication as prescribed by your doctor. °· Continue to use the breathing machine which will help keep your temperature down.  It is common for your temperature to cycle up and down following surgery, especially at night when you are not up moving around and exerting yourself.  The breathing machine keeps your lungs expanded and your temperature down. ° °RANGE OF MOTION AND STRENGTHENING EXERCISES  °Rehabilitation of the knee is important following   a knee injury or an operation. After just a few days of immobilization, the muscles of the thigh which control the knee become weakened and shrink (atrophy). Knee exercises are designed to build up the tone and strength of the thigh muscles and to improve knee motion. Often times heat used for twenty to thirty minutes before working out will loosen up your tissues and help with improving the range of motion but do not use heat for the first two weeks following  surgery. These exercises can be done on a training (exercise) mat, on the floor, on a table or on a bed. Use what ever works the best and is most comfortable for you Knee exercises include:  Leg Lifts - While your knee is still immobilized in a splint or cast, you can do straight leg raises. Lift the leg to 60 degrees, hold for 3 sec, and slowly lower the leg. Repeat 10-20 times 2-3 times daily. Perform this exercise against resistance later as your knee gets better.  Quad and Hamstring Sets - Tighten up the muscle on the front of the thigh (Quad) and hold for 5-10 sec. Repeat this 10-20 times hourly. Hamstring sets are done by pushing the foot backward against an object and holding for 5-10 sec. Repeat as with quad sets.  A rehabilitation program following serious knee injuries can speed recovery and prevent re-injury in the future due to weakened muscles. Contact your doctor or a physical therapist for more information on knee rehabilitation.   SKILLED REHAB INSTRUCTIONS: If the patient is transferred to a skilled rehab facility following release from the hospital, a list of the current medications will be sent to the facility for the patient to continue.  When discharged from the skilled rehab facility, please have the facility set up the patient's [place] prior to being released. Also, the skilled facility will be responsible for providing the patient with their medications at time of release from the facility to include their pain medication, the muscle relaxants, and their blood thinner medication. If the patient is still at the rehab facility at time of the two week follow up appointment, the skilled rehab facility will also need to assist the patient in arranging follow up appointment in our office and any transportation needs.  MAKE SURE YOU:  Understand these instructions.  Will watch your condition.  Will get help right away if you are not doing well or get worse.     Pick up stool softner and laxative for home use following surgery while on pain medications. Do NOT remove your dressing. You may shower.  Do not take tub baths or submerge incision under water. May shower starting three days after surgery. Please use a clean towel to pat the incision dry following showers. Continue to use ice for pain and swelling after surgery. Do not use any lotions or creams on the incision until instructed by your surgeon.  Information on my medicine - ELIQUIS (apixaban)  This medication education was reviewed with me or my healthcare representative as part of my discharge preparation.  The pharmacist that spoke with me during my hospital stay was:    Why was Eliquis prescribed for you? Eliquis was prescribed for you to reduce the risk of blood clots forming after orthopedic surgery.    What do You need to know about Eliquis? Take your Eliquis TWICE DAILY - one tablet in the morning and one tablet in the evening with or without food.  It would  be best to take the dose about the same time each day.  If you have difficulty swallowing the tablet whole please discuss with your pharmacist how to take the medication safely.  Take Eliquis exactly as prescribed by your doctor and DO NOT stop taking Eliquis without talking to the doctor who prescribed the medication.  Stopping without other medication to take the place of Eliquis may increase your risk of developing a clot.  After discharge, you should have regular check-up appointments with your healthcare provider that is prescribing your Eliquis.  What do you do if you miss a dose? If a dose of ELIQUIS is not taken at the scheduled time, take it as soon as possible on the same day and twice-daily administration should be resumed.  The dose should not be doubled to make up for a missed dose.  Do not take more than one tablet of ELIQUIS at the same time.  Important Safety Information A possible side effect of  Eliquis is bleeding. You should call your healthcare provider right away if you experience any of the following: ? Bleeding from an injury or your nose that does not stop. ? Unusual colored urine (red or dark brown) or unusual colored stools (red or black). ? Unusual bruising for unknown reasons. ? A serious fall or if you hit your head (even if there is no bleeding).  Some medicines may interact with Eliquis and might increase your risk of bleeding or clotting while on Eliquis. To help avoid this, consult your healthcare provider or pharmacist prior to using any new prescription or non-prescription medications, including herbals, vitamins, non-steroidal anti-inflammatory drugs (NSAIDs) and supplements.  This website has more information on Eliquis (apixaban): http://[email or web address].com/eliquis/home

## 2017-10-27 LAB — CBC
HEMATOCRIT: 34.7 % — AB (ref 39.0–52.0)
HEMOGLOBIN: 11.7 g/dL — AB (ref 13.0–17.0)
MCH: 31 pg (ref 26.0–34.0)
MCHC: 33.7 g/dL (ref 30.0–36.0)
MCV: 91.8 fL (ref 78.0–100.0)
Platelets: 230 10*3/uL (ref 150–400)
RBC: 3.78 MIL/uL — ABNORMAL LOW (ref 4.22–5.81)
RDW: 13.3 % (ref 11.5–15.5)
WBC: 15.9 10*3/uL — ABNORMAL HIGH (ref 4.0–10.5)

## 2017-10-27 NOTE — Progress Notes (Signed)
Physical Therapy Treatment Patient Details Name: Alejandro Arnold MRN: [id number] DOB: [birth year] Today's Date: 10/27/2017    History of Present Illness 72 yo s/p R TKA. 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  Pt tolerated WB and ambulation better stating pain was much decreased vs just lying in the bed.   Follow Up Recommendations  Home health PT;DC plan and follow up therapy as arranged by surgeon     Equipment Recommendations  None recommended by PT    Recommendations for Other Services OT consult     Precautions / Restrictions Precautions Precautions: Fall;Knee Restrictions Weight Bearing Restrictions: No Other Position/Activity Restrictions: WBAT    Mobility  Bed Mobility Overal bed mobility: Needs Assistance Bed Mobility: Supine to Sit     Supine to sit: Min assist     General bed mobility comments: min assist to manage R LE  Transfers Overall transfer level: Needs assistance Equipment used: Rolling walker (2 wheeled) Transfers: Sit to/from Stand Sit to Stand: Min guard         General transfer comment: cues for LE management and use of UEs to self assist  Ambulation/Gait Ambulation/Gait assistance: Min assist;Min guard Ambulation Distance (Feet): 90 Feet Assistive device: Rolling walker (2 wheeled) Gait Pattern/deviations: Step-to pattern;Decreased step length - left;Decreased step length - right;Shuffle;Trunk flexed Gait velocity: decr Gait velocity interpretation: Below normal speed for age/gender General Gait Details: cues for sequence, posture and position from RW.  Pt tolerating increased R LE WB vs yesterday pm.   Stairs            Wheelchair Mobility    Modified Rankin (Stroke Patients Only)       Balance                                            Cognition Arousal/Alertness:  Awake/alert Behavior During Therapy: WFL for tasks assessed/performed Overall Cognitive Status: Within Functional Limits for tasks assessed                                        Exercises Total Joint Exercises Ankle Circles/Pumps: AROM;Both;20 reps;Supine Quad Sets: AROM;Both;10 reps;Supine Heel Slides: AAROM;Right;15 reps;Supine Straight Leg Raises: AAROM;Other (comment);Right(2 rep - pain limited)    General Comments        Pertinent Vitals/Pain Pain Assessment: 0-10 Pain Score: 4 (after getting OOB and WB on L LE) Pain Location: R knee Pain Descriptors / Indicators: Aching;Sore Pain Intervention(s): Limited activity within patient's tolerance;Monitored during session;Premedicated before session;Ice applied    Home Living                      Prior Function            PT Goals (current goals can now be found in the care plan section) Acute Rehab PT Goals Patient Stated Goal: Regain IND and get back to work` PT Goal Formulation: With patient Time For Goal Achievement: 11/02/17 Potential to Achieve Goals: Good Progress towards PT goals: Progressing toward goals    Frequency    7X/week      PT Plan Current plan remains appropriate    Co-evaluation  AM-PAC PT "6 Clicks" Daily Activity  Outcome Measure  Difficulty turning over in bed (including adjusting bedclothes, sheets and blankets)?: A Lot Difficulty moving from lying on back to sitting on the side of the bed? : A Lot Difficulty sitting down on and standing up from a chair with arms (e.g., wheelchair, bedside commode, etc,.)?: A Lot Help needed moving to and from a bed to chair (including a wheelchair)?: A Little Help needed walking in hospital room?: A Little Help needed climbing 3-5 steps with a railing? : A Little 6 Click Score: 15    End of Session Equipment Utilized During Treatment: Gait belt Activity Tolerance: Patient tolerated treatment well Patient  left: with call bell/phone within reach;in bed Nurse Communication: Mobility status PT Visit Diagnosis: Pain;Difficulty in walking, not elsewhere classified (R26.2) Pain - Right/Left: Right Pain - part of body: Knee     Time: 1638-4536 PT Time Calculation (min) (ACUTE ONLY): 40 min  Charges:  $Gait Training: 23-37 mins $Therapeutic Exercise: 8-22 mins                    G Codes:       Pg [phone]    Tyri Elmore 10/27/2017, 1:00 PM

## 2017-10-27 NOTE — Progress Notes (Signed)
Discharge Planning: Spoke to pt and he goes to [facility]. Faxed op note, H&P and progress note. Scheduled dc today with [facility]. Wife to assist him at home. Has RW, bedside commode, and cane. Jonnie Finner RN CCM Case Mgmt phone [phone]

## 2017-10-27 NOTE — Progress Notes (Signed)
Subjective: 2 Days Post-Op Procedure(s) (LRB): RIGHT TOTAL KNEE ARTHROPLASTY WITH COMPUTER NAVIGATION (Right) Patient reports pain as 3 on 0-10 scale.    Objective: Vital signs in last 24 hours: Temp:  [97.7 F (36.5 C)-98.5 F (36.9 C)] 98.5 F (36.9 C) (01/26 0523) Pulse Rate:  [58-84] 73 (01/26 0523) Resp:  [16-18] 18 (01/26 0523) BP: (104-130)/(53-70) 130/63 (01/26 0523) SpO2:  [93 %-96 %] 96 % (01/26 0523)  Intake/Output from previous day: 01/25 0701 - 01/26 0700 In: 1902.5 [P.O.:860; I.V.:1042.5] Out: 1875 [Urine:1875] Intake/Output this shift: Total I/O In: 120 [P.O.:120] Out: 250 [Urine:250]  Recent Labs    10/24/17 1210 10/26/17 0527 10/27/17 0552  HGB 13.3 12.2* 11.7*   Recent Labs    10/26/17 0527 10/27/17 0552  WBC 9.6 15.9*  RBC 3.93* 3.78*  HCT 36.8* 34.7*  PLT 221 230   Recent Labs    10/24/17 1210 10/26/17 0527  NA 136 139  K 4.7 4.3  CL 103 105  CO2 26 28  BUN 15 13  CREATININE 0.93 0.89  GLUCOSE 102* 99  CALCIUM 9.1 8.6*   No results for input(s): LABPT, INR in the last 72 hours.  Neurologically intact ABD soft Sensation intact distally Intact pulses distally Incision: dressing C/D/I and no drainage  Assessment/Plan: 2 Days Post-Op Procedure(s) (LRB): RIGHT TOTAL KNEE ARTHROPLASTY WITH COMPUTER NAVIGATION (Right) Advance diet Up with therapy D/C IV fluids Discharge home with home health  D/C instr given  Neela Zecca C 10/27/2017, 8:10 AM

## 2017-10-27 NOTE — Progress Notes (Signed)
Discussed with patient discharge instructions, he verbalized agreement and understanding.  Patient to go home with all belongings in private vehicle.

## 2017-10-27 NOTE — Progress Notes (Signed)
Physical Therapy Treatment Patient Details Name: Alejandro Arnold MRN: [id number] DOB: 09-12-1946 Today's Date: 10/27/2017    History of Present Illness 72 yo s/p R TKA. 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   Follow Up Recommendations  Home health PT;DC plan and follow up therapy as arranged by surgeon     Equipment Recommendations  None recommended by PT    Recommendations for Other Services OT consult     Precautions / Restrictions Precautions Precautions: Fall;Knee Restrictions Weight Bearing Restrictions: No Other Position/Activity Restrictions: WBAT    Mobility  Bed Mobility Overal bed mobility: Needs Assistance Bed Mobility: Supine to Sit     Supine to sit: Min assist     General bed mobility comments: min assist to manage R LE  Transfers Overall transfer level: Needs assistance Equipment used: Rolling walker (2 wheeled) Transfers: Sit to/from Stand Sit to Stand: Min guard         General transfer comment: cues for LE management and use of UEs to self assist  Ambulation/Gait Ambulation/Gait assistance: Min guard;Supervision Ambulation Distance (Feet): 55 Feet Assistive device: Rolling walker (2 wheeled) Gait Pattern/deviations: Step-to pattern;Decreased step length - left;Decreased step length - right;Shuffle;Trunk flexed Gait velocity: decr Gait velocity interpretation: Below normal speed for 72/gender General Gait Details: cues for sequence, posture and position from RW.  Pt tolerating increased R LE WB vs yesterday pm.   Stairs Stairs: Yes   Stair Management: One rail Left;Two rails;Step to pattern;Forwards;With crutches Number of Stairs: 5 General stair comments: 2 stairs with bil rails and 3 stairs with rail and  crutch  Wheelchair Mobility    Modified Rankin (Stroke Patients Only)       Balance                                            Cognition Arousal/Alertness: Awake/alert Behavior During Therapy: WFL for tasks assessed/performed Overall Cognitive Status: Within Functional Limits for tasks assessed                                        Exercises Total Joint Exercises Ankle Circles/Pumps: AROM;Both;20 reps;Supine Quad Sets: AROM;Both;10 reps;Supine Heel Slides: AAROM;Right;15 reps;Supine Straight Leg Raises: AAROM;Other (comment);Right(2 rep - pain limited)    General Comments        Pertinent Vitals/Pain Pain Assessment: 0-10 Pain Score: 4  Pain Location: R knee Pain Descriptors / Indicators: Aching;Sore Pain Intervention(s): Limited activity within patient's tolerance;Monitored during session;Premedicated before session;Ice applied    Home Living                      Prior Function            PT Goals (current goals can now be found in the care plan section) Acute Rehab PT Goals Patient Stated Goal: Regain IND and get back to work` PT Goal Formulation: With patient Time For Goal Achievement: 11/02/17 Potential to Achieve Goals: Good Progress towards PT goals: Progressing toward goals    Frequency    7X/week  PT Plan Current plan remains appropriate    Co-evaluation              AM-PAC PT "6 Clicks" Daily Activity  Outcome Measure  Difficulty turning over in bed (including adjusting bedclothes, sheets and blankets)?: A Lot Difficulty moving from lying on back to sitting on the side of the bed? : A Lot Difficulty sitting down on and standing up from a chair with arms (e.g., wheelchair, bedside commode, etc,.)?: A Lot Help needed moving to and from a bed to chair (including a wheelchair)?: A Little Help needed walking in hospital room?: A Little Help needed climbing 3-5 steps with a railing? : A  Little 6 Click Score: 15    End of Session Equipment Utilized During Treatment: Gait belt Activity Tolerance: Patient tolerated treatment well Patient left: with call bell/phone within reach;in bed Nurse Communication: Mobility status PT Visit Diagnosis: Pain;Difficulty in walking, not elsewhere classified (R26.2) Pain - Right/Left: Right Pain - part of body: Knee     Time: 4696-2952 PT Time Calculation (min) (ACUTE ONLY): 32 min  Charges:  $Gait Training: 23-37 mins $Therapeutic Exercise: 8-22 mins                    G Codes:       Pg [phone]    Alejandro Arnold 10/27/2017, 3:31 PM

## 2017-10-27 NOTE — Progress Notes (Signed)
Occupational Therapy Treatment Patient Details Name: Alejandro Arnold MRN: [id number] DOB: [birth year] Today's Date: 10/27/2017    History of present illness 72 yo s/p R TKA. 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 Seen for skilled OT session.  Reports wife able to assist with LB ADLs so declines need for A/E.  Attempted tub transfer and at this time states he will sponge bathe until he is able to bring RLE over ledge of tub.  Will notify OTR/L to sign off.    Follow Up Recommendations  No OT follow up;Supervision/Assistance - 24 hour    Equipment Recommendations  None recommended by OT    Recommendations for Other Services      Precautions / Restrictions Precautions Precautions: Fall;Knee Restrictions Weight Bearing Restrictions: No Other Position/Activity Restrictions: WBAT       Mobility Bed Mobility               General bed mobility comments: seated in recliner at beginning and end of session  Transfers                      Balance                                           ADL either performed or assessed with clinical judgement   ADL Overall ADL's : Needs assistance/impaired               Lower Body Bathing Details (indicate cue type and reason): Declines need for A/E states wife able to assist with LB adls       Lower Body Dressing Details (indicate cue type and reason): declines need for A/E states wife able to assist with LB adls         Tub/ Shower Transfer: Tub transfer;Rolling walker;Grab bars Tub/Shower Transfer Details (indicate cue type and reason): attempted tub transfer side step over tub ledge for tub with left faucet., at this time pt. unable to demonstrate the amount of knee flexion to clear the ledge and agreed he felt better sponge bathing initially until he practices tub tansfer at home with cont. therapy. Functional mobility during ADLs:  Min guard;Rolling walker General ADL Comments: declined A/E reports wife to assist     Vision       Perception     Praxis      Cognition Arousal/Alertness: Awake/alert Behavior During Therapy: WFL for tasks assessed/performed Overall Cognitive Status: Within Functional Limits for tasks assessed                                          Exercises     Shoulder Instructions       General Comments      Pertinent Vitals/ Pain       Pain Assessment: No/denies pain Pain Intervention(s): Premedicated before session  Home Living                                          Prior Functioning/Environment              Frequency  Min 2X/week  Progress Toward Goals  OT Goals(current goals can now be found in the care plan section)  Progress towards OT goals: Goals met/education completed, patient discharged from [place] PT "6 Clicks" Daily Activity     Outcome Measure   Help from another person eating meals?: None Help from another person taking care of personal grooming?: None Help from another person toileting, which includes using toliet, bedpan, or urinal?: A Little Help from another person bathing (including washing, rinsing, drying)?: A Little Help from another person to put on and taking off regular upper body clothing?: None Help from another person to put on and taking off regular lower body clothing?: A Little 6 Click Score: 21    End of Session Equipment Utilized During Treatment: Gait belt;Rolling walker  OT Visit Diagnosis: Unsteadiness on feet (R26.81);Pain Pain - Right/Left: Right Pain - part of body: Knee   Activity Tolerance Patient tolerated treatment well   Patient Left in chair;with call bell/phone within reach;with chair alarm set   Nurse Communication          Time: 4175-3010 OT Time Calculation (min): 20 min  Charges: OT General  Charges $OT Visit: 1 Visit OT Treatments $Self Care/Home Management : 8-22 mins  Janice Coffin, COTA/L 10/27/2017, 11:45 AM

## 2018-04-30 ENCOUNTER — Other Ambulatory Visit: Payer: Self-pay | Admitting: Plastic Surgery

## 2018-06-14 ENCOUNTER — Other Ambulatory Visit: Payer: Self-pay | Admitting: Plastic Surgery

## 2018-07-26 IMAGING — DX DG KNEE 1-2V PORT*R*
2 series · 2 of 2 positions shown · non-contrast
Comparison: None.

CLINICAL DATA: Knee replacement.

EXAM:
PORTABLE RIGHT KNEE - 1-2 VIEW

[knee ap]
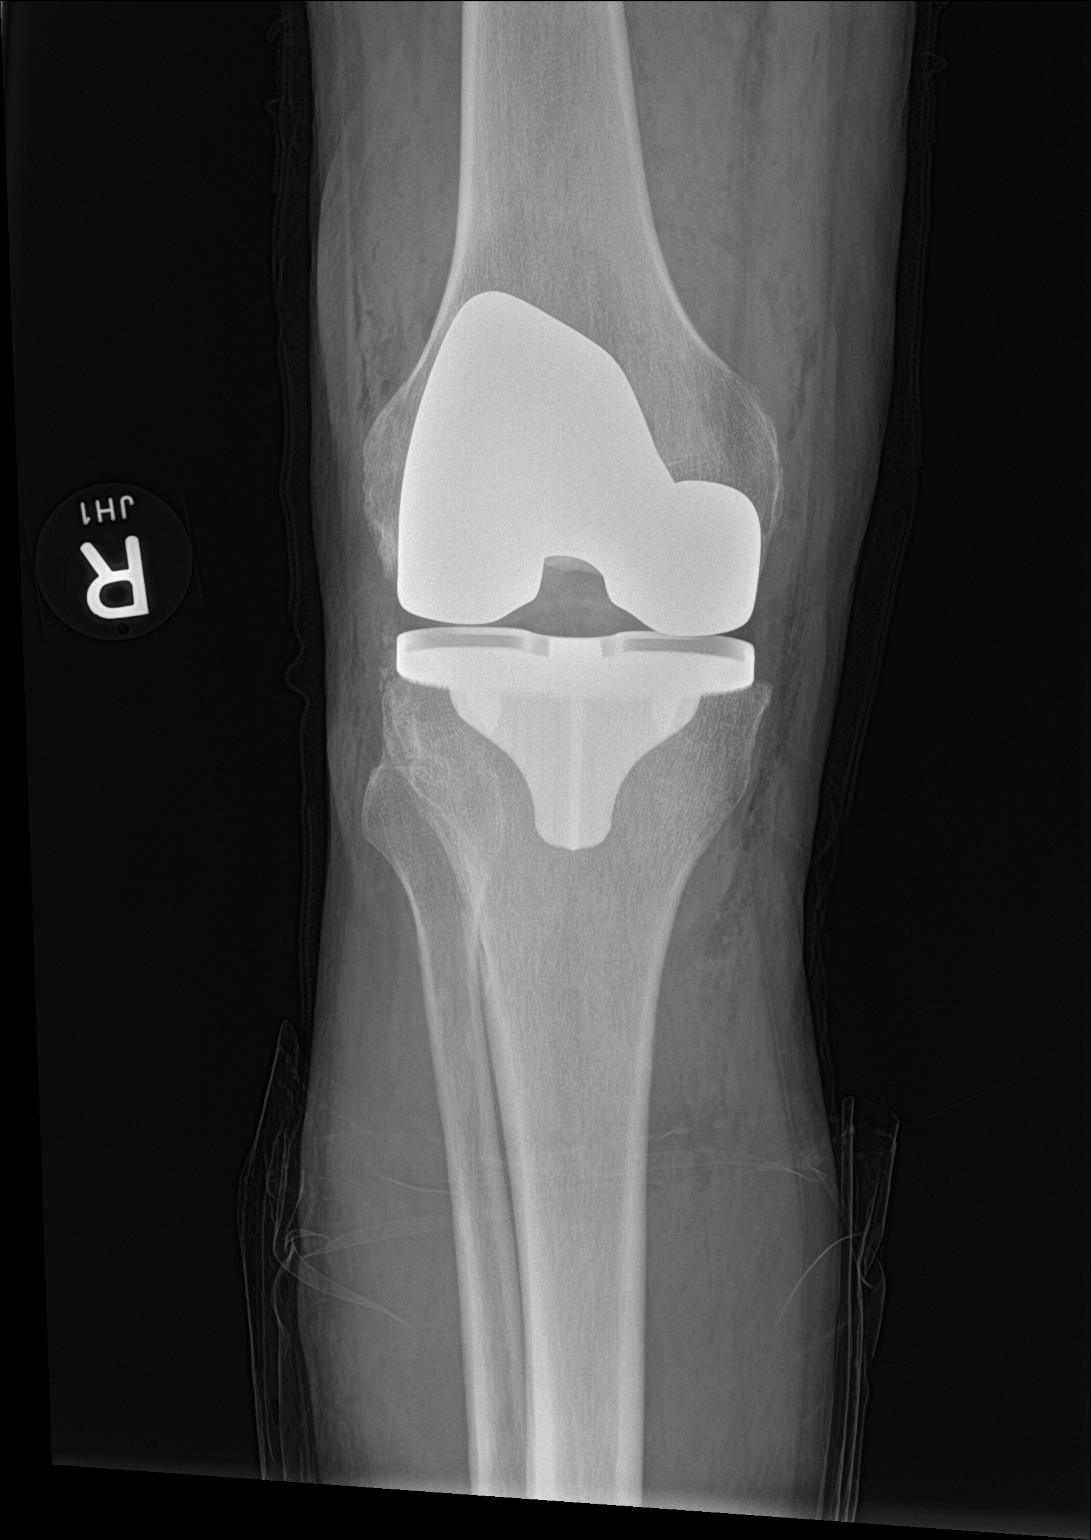

[knee lat]
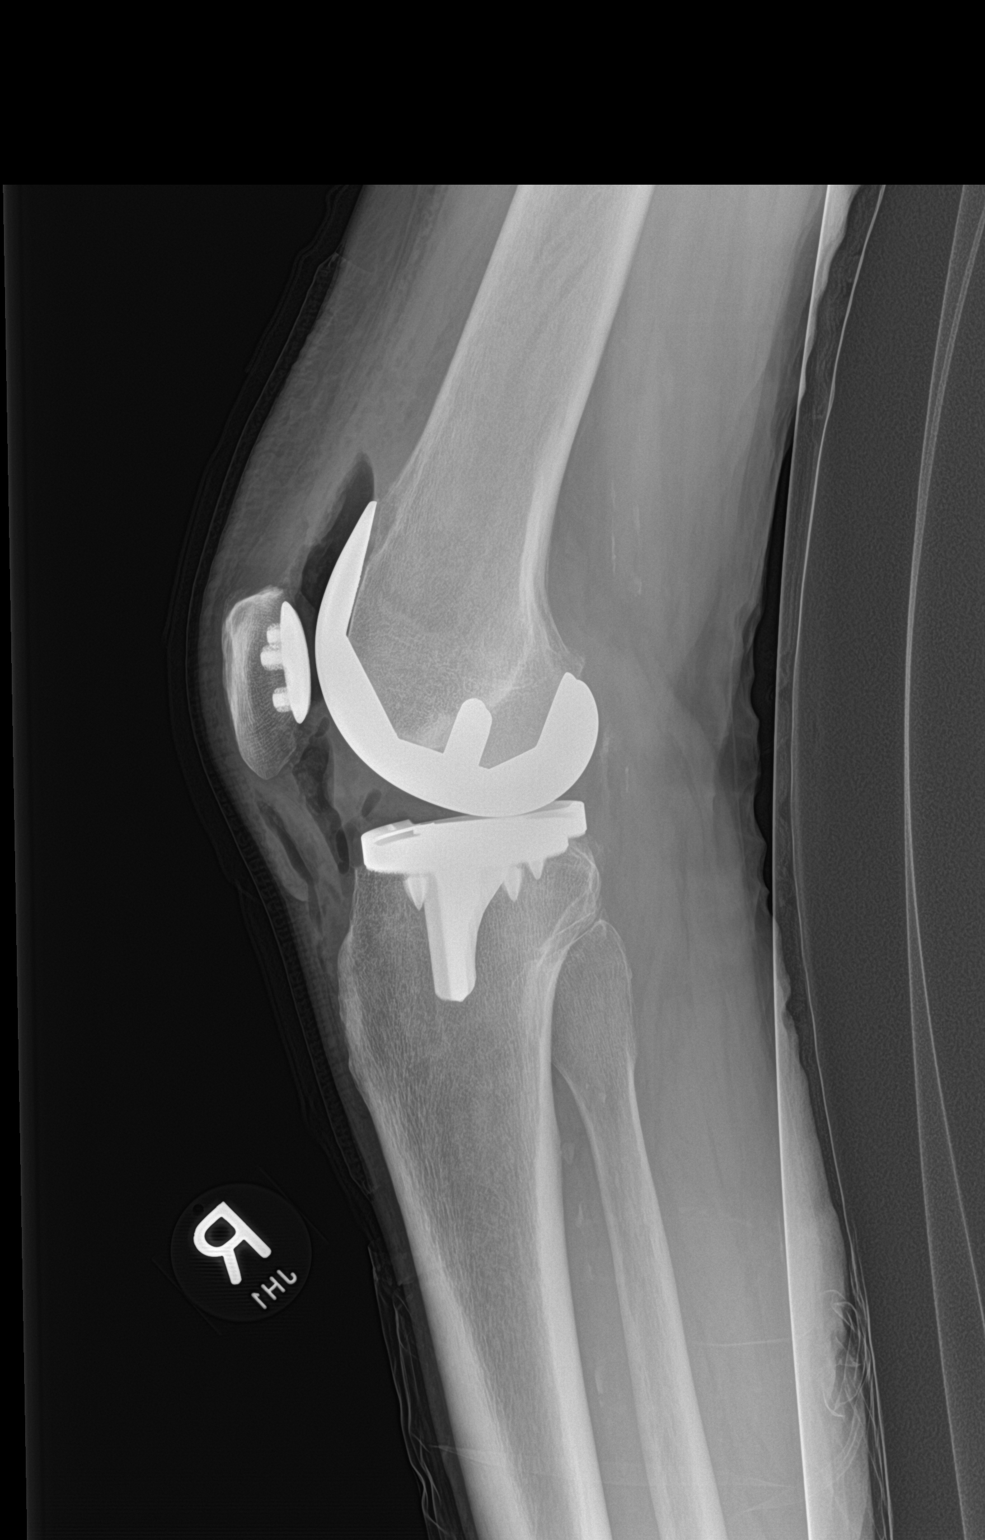

[2 of 2 positions shown; findings below may reference images not displayed]

FINDINGS: Right total knee arthroplasty. Knee is located without a
periprosthetic fracture. Expected gas within the soft tissues.
IMPRESSION: Knee replacement without complicating features.

## 2020-05-03 ENCOUNTER — Emergency Department (HOSPITAL_COMMUNITY)
Admission: EM | Admit: 2020-05-03 | Discharge: 2020-05-03 | Disposition: A | Discharge status: Home / Self Care | Payer: No Typology Code available for payment source | Attending: Emergency Medicine | Admitting: Emergency Medicine

## 2020-05-03 ENCOUNTER — Other Ambulatory Visit: Payer: Self-pay

## 2020-05-03 ENCOUNTER — Encounter (HOSPITAL_COMMUNITY): Payer: Self-pay

## 2020-05-03 DIAGNOSIS — C4491 Basal cell carcinoma of skin, unspecified: Secondary | ICD-10-CM | POA: Insufficient documentation

## 2020-05-03 DIAGNOSIS — I2699 Other pulmonary embolism without acute cor pulmonale: Secondary | ICD-10-CM | POA: Insufficient documentation

## 2020-05-03 DIAGNOSIS — Z96651 Presence of right artificial knee joint: Secondary | ICD-10-CM | POA: Diagnosis not present

## 2020-05-03 DIAGNOSIS — F17210 Nicotine dependence, cigarettes, uncomplicated: Secondary | ICD-10-CM | POA: Diagnosis not present

## 2020-05-03 DIAGNOSIS — Z7982 Long term (current) use of aspirin: Secondary | ICD-10-CM | POA: Insufficient documentation

## 2020-05-03 HISTORY — DX: Disorder of thyroid, unspecified: E07.9

## 2020-05-03 LAB — BASIC METABOLIC PANEL
Anion gap: 8 (ref 5–15)
BUN: 18 mg/dL (ref 8–23)
CO2: 26 mmol/L (ref 22–32)
Calcium: 8.5 mg/dL — ABNORMAL LOW (ref 8.9–10.3)
Chloride: 103 mmol/L (ref 98–111)
Creatinine, Ser: 1.04 mg/dL (ref 0.61–1.24)
GFR calc Af Amer: >60 mL/min (ref >60)
GFR calc non Af Amer: >60 mL/min (ref >60)
Glucose, Bld: 109 mg/dL — ABNORMAL HIGH (ref 70–99)
Potassium: 4.1 mmol/L (ref 3.5–5.1)
Sodium: 137 mmol/L (ref 135–145)

## 2020-05-03 LAB — CBC WITH DIFFERENTIAL/PLATELET
Abs Immature Granulocytes: 0.03 10*3/uL (ref 0.00–0.07)
Basophils Absolute: 0.1 10*3/uL (ref 0.0–0.1)
Basophils Relative: 1 %
Eosinophils Absolute: 1.9 10*3/uL — ABNORMAL HIGH (ref 0.0–0.5)
Eosinophils Relative: 16 %
HCT: 41.8 % (ref 39.0–52.0)
Hemoglobin: 13.4 g/dL (ref 13.0–17.0)
Immature Granulocytes: 0 %
Lymphocytes Relative: 12 %
Lymphs Abs: 1.4 10*3/uL (ref 0.7–4.0)
MCH: 29.7 pg (ref 26.0–34.0)
MCHC: 32.1 g/dL (ref 30.0–36.0)
MCV: 92.7 fL (ref 80.0–100.0)
Monocytes Absolute: 1.1 10*3/uL — ABNORMAL HIGH (ref 0.1–1.0)
Monocytes Relative: 9 %
Neutro Abs: 7.7 10*3/uL (ref 1.7–7.7)
Neutrophils Relative %: 62 %
Platelets: 213 10*3/uL (ref 150–400)
RBC: 4.51 MIL/uL (ref 4.22–5.81)
RDW: 14.7 % (ref 11.5–15.5)
WBC: 12.2 10*3/uL — ABNORMAL HIGH (ref 4.0–10.5)
nRBC: 0 % (ref 0.0–0.2)

## 2020-05-03 MED ORDER — RIVAROXABAN (XARELTO) VTE STARTER PACK (15 & 20 MG)
ORAL_TABLET | ORAL | 0 refills | Status: DC
Start: 2020-05-03 — End: 2020-05-04

## 2020-05-03 MED ORDER — RIVAROXABAN 15 MG PO TABS
15.0000 mg | ORAL_TABLET | Freq: Once | ORAL | Status: AC
  Administered 2020-05-03: 15 mg via ORAL
  Filled 2020-05-03: qty 1
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
## 2020-05-03 NOTE — ED Triage Notes (Signed)
[facility] that he had a [facility] in each lung and to go to the ED. Patient states a history of lung cancer. Patient denies SOB. Patient states he has a chronic cough because he smokes.

## 2020-05-03 NOTE — ED Notes (Signed)
Delay in discharge due to needing to find pt a ride home.

## 2020-05-03 NOTE — Discharge Instructions (Addendum)
You are being started on blood thinning medication. Do not continue to take aspirin or any other NSAIDs like ibuprofen, aleve, naprosyn, etc because they will increase your bleeding risk. Follow-up with your family doctor.   Information on my medicine - XARELTO (rivaroxaban)  WHY WAS XARELTO PRESCRIBED FOR YOU? Xarelto was prescribed to treat blood clots that may have been found in the veins of your legs (deep vein thrombosis) or in your lungs (pulmonary embolism) and to reduce the risk of them occurring again.  What do you need to know about Xarelto? The starting dose is one 15 mg tablet taken TWICE daily with food for the FIRST 21 DAYS then on (enter date)  05/24/20  the dose is changed to one 20 mg tablet taken ONCE A DAY with your evening meal.  DO NOT stop taking Xarelto without talking to the health care provider who prescribed the medication.  Refill your prescription for 20 mg tablets before you run out.  After discharge, you should have regular check-up appointments with your healthcare provider that is prescribing your Xarelto.  In the future your dose may need to be changed if your kidney function changes by a significant amount.  What do you do if you miss a dose? If you are taking Xarelto TWICE DAILY and you miss a dose, take it as soon as you remember. You may take two 15 mg tablets (total 30 mg) at the same time then resume your regularly scheduled 15 mg twice daily the next day.  If you are taking Xarelto ONCE DAILY and you miss a dose, take it as soon as you remember on the same day then continue your regularly scheduled once daily regimen the next day. Do not take two doses of Xarelto at the same time.   Important Safety Information Xarelto is a blood thinner medicine that can cause bleeding. You should call your healthcare provider right away if you experience any of the following: ? Bleeding from an injury or your nose that does not stop. ? Unusual colored urine  (red or dark brown) or unusual colored stools (red or black). ? Unusual bruising for unknown reasons. ? A serious fall or if you hit your head (even if there is no bleeding).  Some medicines may interact with Xarelto and might increase your risk of bleeding while on Xarelto. To help avoid this, consult your healthcare provider or pharmacist prior to using any new prescription or non-prescription medications, including herbals, vitamins, non-steroidal anti-inflammatory drugs (NSAIDs) and supplements.  This website has more information on Xarelto: [email or web address]

## 2020-05-03 NOTE — Progress Notes (Signed)
CSW facilitated a ride for the pt through [facility] after confirming his elderly wife had no immediate family contacts to provide transportation.  Melburn Popper arrived and picked up the pt.  Pt's wife was updated and was appreciative and thanked the CSW.  RN updated.  Please reconsult if future social work needs arise.  CSW signing off, as social work intervention is no longer needed.  Alphonse Guild. Bracen Schum  MSW, LCSW, LCAS, CCS Transitions of Care Clinical Social Worker Care Coordination Department Ph: [phone]

## 2020-05-04 ENCOUNTER — Telehealth (HOSPITAL_COMMUNITY): Payer: Self-pay | Admitting: Emergency Medicine

## 2020-05-04 LAB — PATHOLOGIST SMEAR REVIEW

## 2020-05-04 MED ORDER — RIVAROXABAN (XARELTO) VTE STARTER PACK (15 & 20 MG): ORAL_TABLET | ORAL | 0 refills | Status: DC

## 2020-05-04 MED ORDER — RIVAROXABAN (XARELTO) VTE STARTER PACK (15 & 20 MG): ORAL_TABLET | ORAL | 0 refills | Status: AC

## 2020-05-04 NOTE — Telephone Encounter (Signed)
Patient requesting paper Rx for Xarelto he can take to the [place].

## 2020-05-04 NOTE — ED Notes (Signed)
Ms Riva called regarding prescription was not sent to [facility]. Writer spoke with Dr Karle Starch who gave verbal for writer to call to pharmacy. Pharmacist stated they do not have the medication starter pack as requested. Spoke with Ms Brisby again who requested med to be called to [facility] in [place]. The message was left including all information needed plus they have a coupon for a months supply to bring in when they pick up prescription. Also, call back number for writer left on recording in case any additional information is needed.

## 2021-07-26 ENCOUNTER — Telehealth: Payer: Self-pay | Admitting: Pulmonary Disease

## 2021-07-29 ENCOUNTER — Institutional Professional Consult (permissible substitution): Payer: No Typology Code available for payment source | Admitting: Pulmonary Disease

## 2021-08-02 NOTE — Telephone Encounter (Signed)
Patient was scheduled for consult with Dr. Ander Slade on 07/29/2021. Patient's spouse, Pamala Hurry called to let us know that patient passed away this morning.  I gave our deepest condolences. Nothing further needed at this time.

## 2021-08-02 DEATH — deceased
